# Patient Record
Sex: Male | Born: 1955
Health system: Southern US, Community
[De-identification: ages and names within clinical notes are randomized; demographics above are authoritative.]

## PROBLEM LIST (undated history)

## (undated) DIAGNOSIS — Z87442 Personal history of urinary calculi: Secondary | ICD-10-CM

## (undated) DIAGNOSIS — I10 Essential (primary) hypertension: Secondary | ICD-10-CM

## (undated) HISTORY — PX: APPENDECTOMY: SHX54

---

## 2002-05-26 ENCOUNTER — Encounter: Payer: Self-pay | Admitting: General Surgery

## 2002-05-26 ENCOUNTER — Encounter: Admission: RE | Admit: 2002-05-26 | Discharge: 2002-05-26 | Payer: Self-pay | Admitting: General Surgery

## 2002-05-27 ENCOUNTER — Ambulatory Visit (HOSPITAL_BASED_OUTPATIENT_CLINIC_OR_DEPARTMENT_OTHER): Admission: RE | Admit: 2002-05-27 | Discharge: 2002-05-27 | Payer: Self-pay | Admitting: General Surgery

## 2007-10-30 ENCOUNTER — Ambulatory Visit: Payer: Self-pay | Admitting: Urology

## 2007-11-06 ENCOUNTER — Ambulatory Visit: Payer: Self-pay | Admitting: Urology

## 2007-11-20 ENCOUNTER — Ambulatory Visit: Payer: Self-pay | Admitting: Urology

## 2007-11-27 ENCOUNTER — Ambulatory Visit: Payer: Self-pay | Admitting: Urology

## 2007-12-09 ENCOUNTER — Ambulatory Visit: Payer: Self-pay | Admitting: Urology

## 2008-01-09 ENCOUNTER — Ambulatory Visit: Payer: Self-pay | Admitting: Urology

## 2008-02-19 ENCOUNTER — Ambulatory Visit: Payer: Self-pay | Admitting: Urology

## 2008-05-24 ENCOUNTER — Ambulatory Visit: Payer: Self-pay | Admitting: Urology

## 2009-06-10 IMAGING — CR DG ABDOMEN 1V
1 series · 1 of 1 positions shown · non-contrast
Comparison: none

REASON FOR EXAM: kidney stones pt need films
COMMENTS:

[view not recorded]
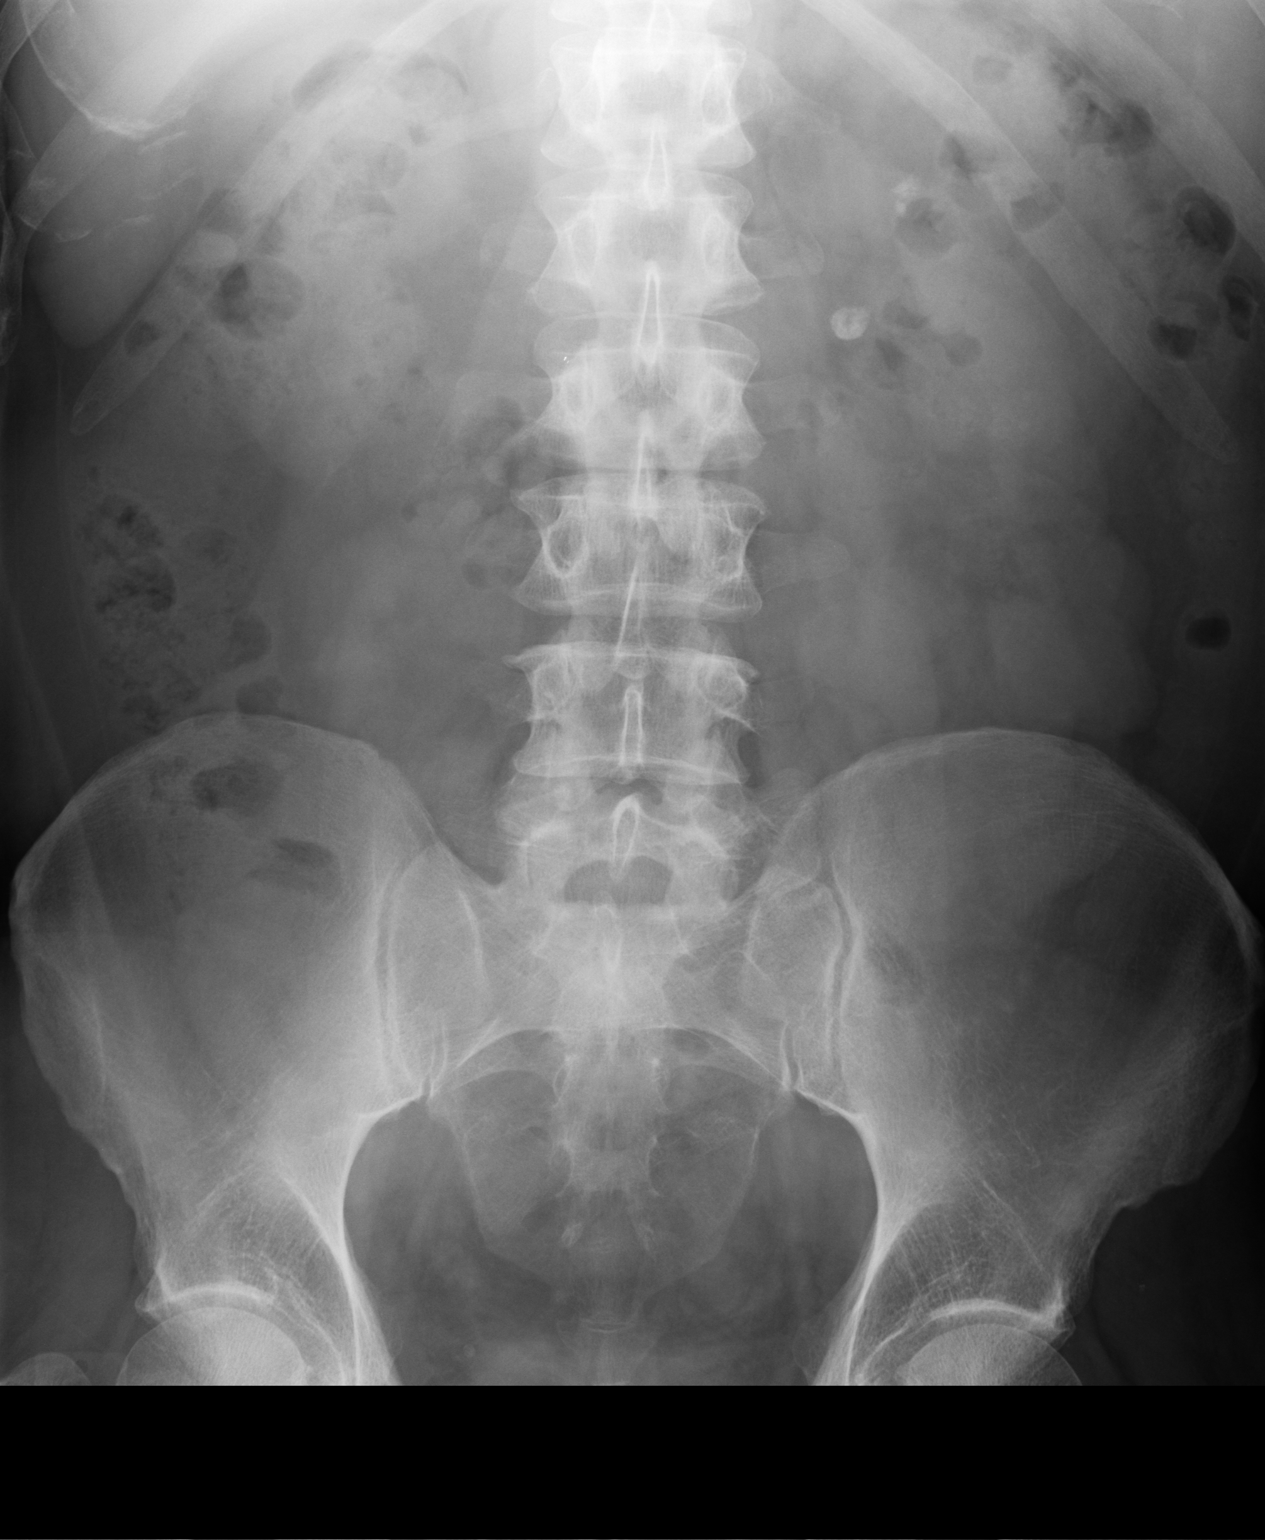

[1 of 1 positions shown; findings below may reference images not displayed]

PROCEDURE:     DXR - DXR KIDNEY URETER BLADDER  - November 20, 2007  [DATE]

RESULT:     Comparison is made to the prior exam of 11/06/2007. Multiple LEFT
renal calcifications are noted in the midpole region. A 1 cm calcification
is present in the region of the LEFT renal pelvis or proximal LEFT ureter
and which corresponds to the location of a similar stone noted at prior CT.
No renal calcifications are seen on the RIGHT.
IMPRESSION: 1.     LEFT nephrolithiasis with there being a 1 cm stone in the LEFT renal
pelvis or proximal LEFT ureter.

## 2009-06-29 IMAGING — CR DG ABDOMEN 1V
1 series · 1 of 1 positions shown · non-contrast
Comparison: none

REASON FOR EXAM: Nephrolithiasis - SEND FILM WITH PATIENT
COMMENTS:

[view not recorded]
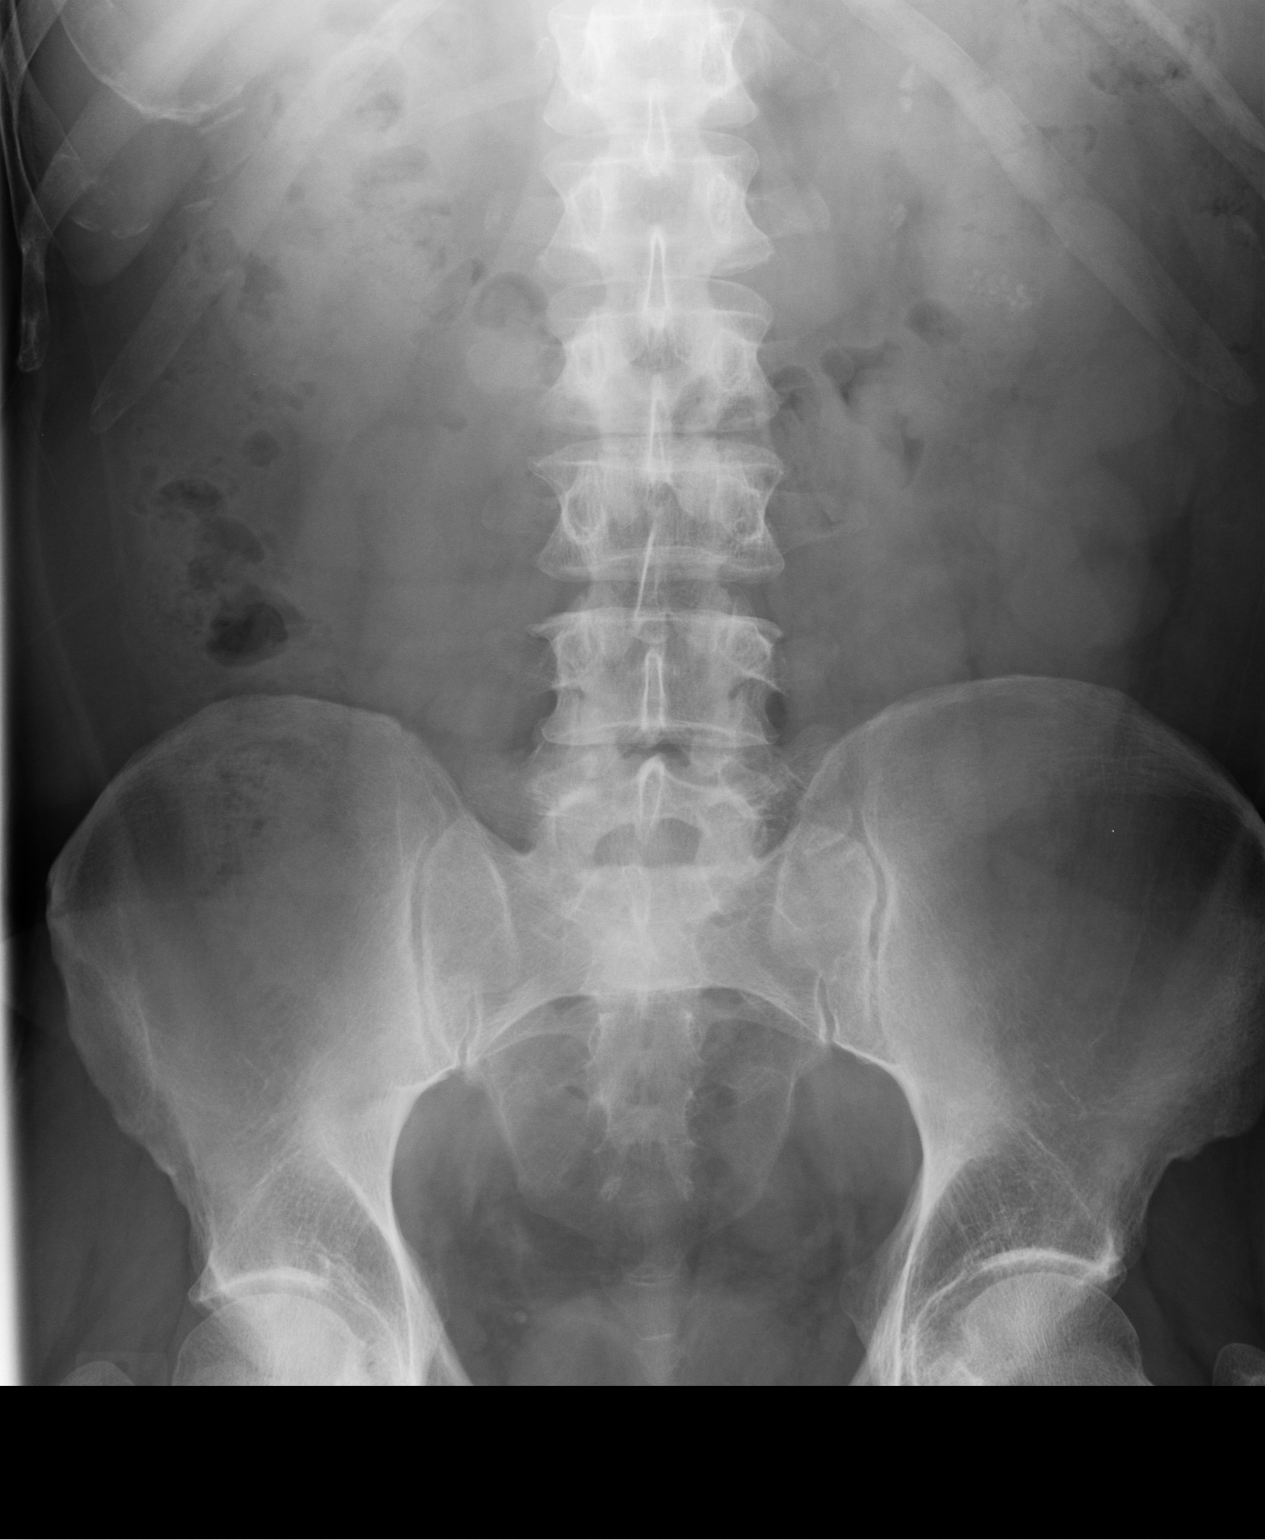

[1 of 1 positions shown; findings below may reference images not displayed]

PROCEDURE:     DXR - DXR KIDNEY URETER BLADDER  - December 09, 2007  [DATE]

RESULT:     Comparison is made to a prior exam 11/27/2007. The large
calcification at the LEFT renal pelvis is markedly smaller and now measures
approximately 6.5 mm at maximum diameter. Multiple stones or stone fragments
are noted in the LEFT lower pole calyces. Two or three stones are again seen
at the upper pole of the LEFT kidney. No definite RIGHT renal stones are
seen. No ureteral calcifications are identified.
IMPRESSION: 1.     LEFT nephrolithiasis as noted above.

## 2009-12-30 ENCOUNTER — Ambulatory Visit: Payer: Self-pay | Admitting: Urology

## 2010-02-03 ENCOUNTER — Ambulatory Visit: Payer: Self-pay | Admitting: Urology

## 2010-02-09 ENCOUNTER — Ambulatory Visit: Payer: Self-pay | Admitting: Urology

## 2010-03-13 ENCOUNTER — Ambulatory Visit: Payer: Self-pay | Admitting: Urology

## 2010-03-23 ENCOUNTER — Ambulatory Visit: Payer: Self-pay | Admitting: Urology

## 2010-04-04 ENCOUNTER — Ambulatory Visit: Payer: Self-pay | Admitting: Urology

## 2010-04-24 ENCOUNTER — Ambulatory Visit: Payer: Self-pay | Admitting: Urology

## 2010-05-05 ENCOUNTER — Ambulatory Visit: Payer: Self-pay | Admitting: Urology

## 2010-05-08 ENCOUNTER — Ambulatory Visit: Payer: Self-pay | Admitting: Urology

## 2010-08-07 ENCOUNTER — Ambulatory Visit: Payer: Self-pay | Admitting: Urology

## 2010-09-27 ENCOUNTER — Other Ambulatory Visit: Payer: Self-pay | Admitting: Neurosurgery

## 2010-09-27 DIAGNOSIS — M47812 Spondylosis without myelopathy or radiculopathy, cervical region: Secondary | ICD-10-CM

## 2010-09-28 ENCOUNTER — Ambulatory Visit
Admission: RE | Admit: 2010-09-28 | Discharge: 2010-09-28 | Disposition: A | Discharge status: Home / Self Care | Payer: 59 | Source: Ambulatory Visit | Attending: Neurosurgery | Admitting: Neurosurgery

## 2010-09-28 DIAGNOSIS — M47812 Spondylosis without myelopathy or radiculopathy, cervical region: Secondary | ICD-10-CM

## 2010-11-07 ENCOUNTER — Other Ambulatory Visit: Payer: Self-pay | Admitting: Neurosurgery

## 2010-11-07 DIAGNOSIS — M47812 Spondylosis without myelopathy or radiculopathy, cervical region: Secondary | ICD-10-CM

## 2010-11-08 ENCOUNTER — Ambulatory Visit
Admission: RE | Admit: 2010-11-08 | Discharge: 2010-11-08 | Disposition: A | Discharge status: Home / Self Care | Payer: 59 | Source: Ambulatory Visit | Attending: Neurosurgery | Admitting: Neurosurgery

## 2010-11-08 DIAGNOSIS — M47812 Spondylosis without myelopathy or radiculopathy, cervical region: Secondary | ICD-10-CM

## 2011-02-05 ENCOUNTER — Ambulatory Visit: Payer: Self-pay | Admitting: Urology

## 2011-08-25 IMAGING — CR DG IVP HYPERTENSIVE
1 series · 8 of 10 positions shown · non-contrast
Comparison: none

REASON FOR EXAM: nephrolithiasis
COMMENTS:

[Series 1: view not recorded · 0.17mm/px · 8 of 14 slices shown]
[im 1/14]
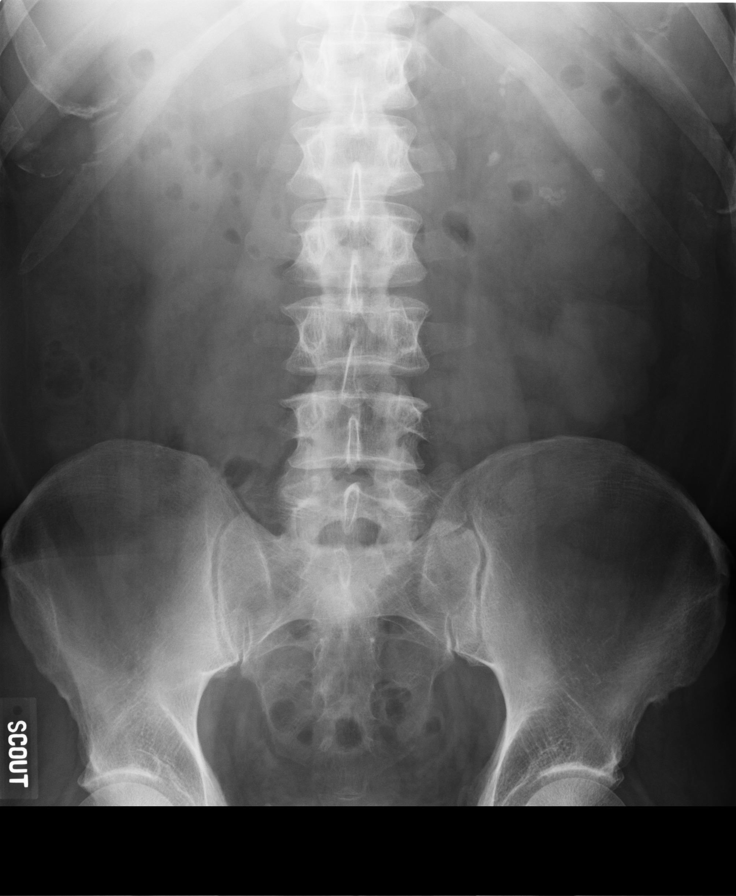
[im 2/14]
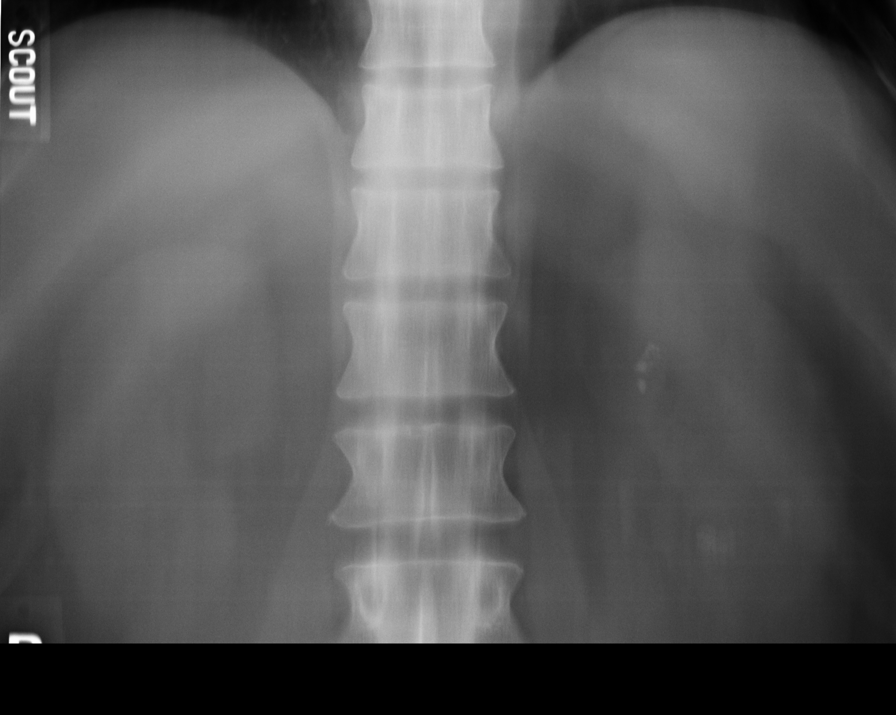
[im 3/14]
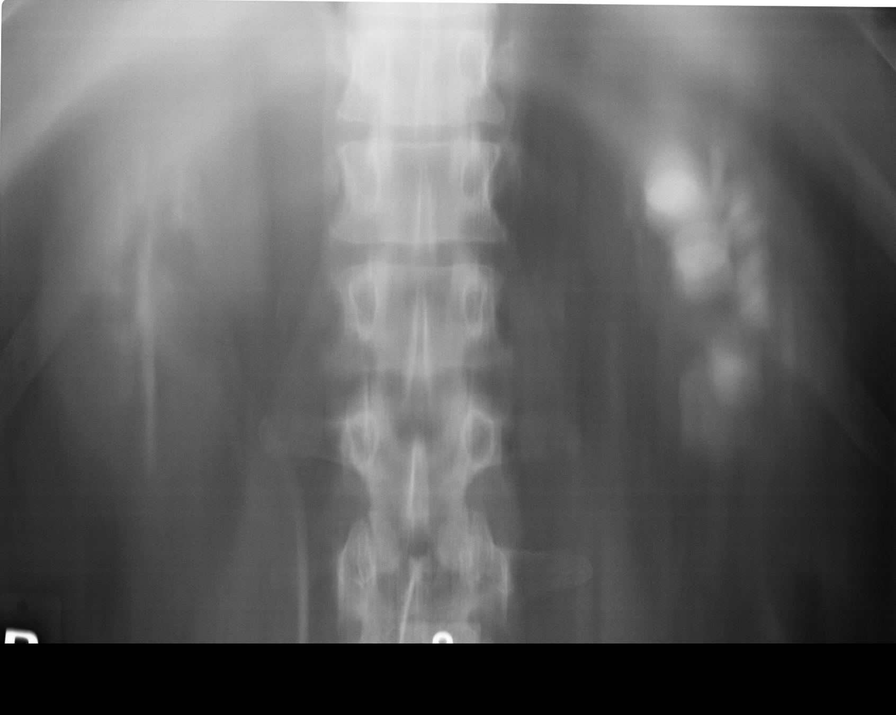
[im 5/14]
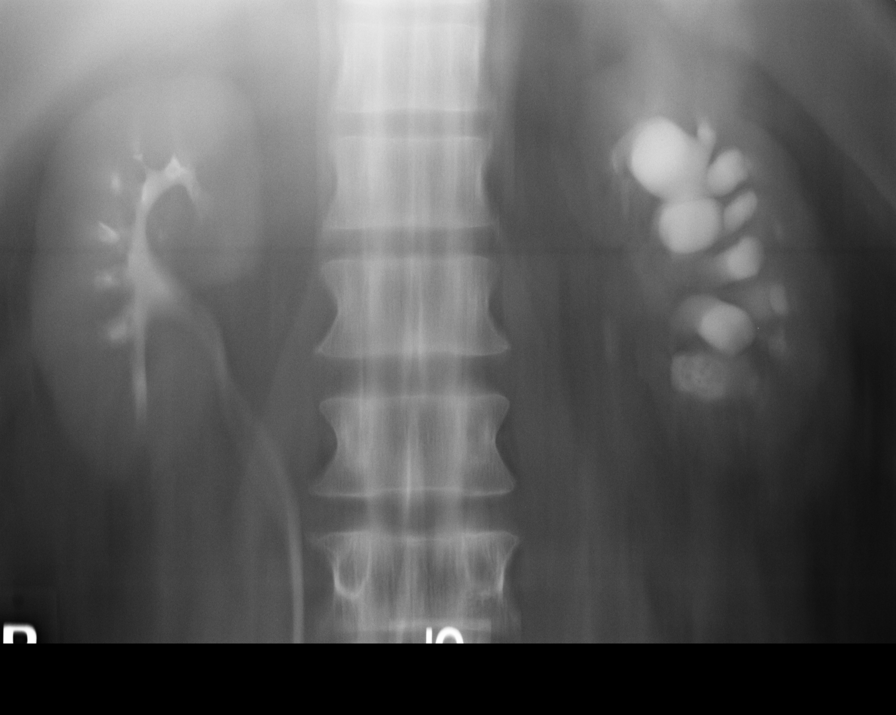
[im 6/14]
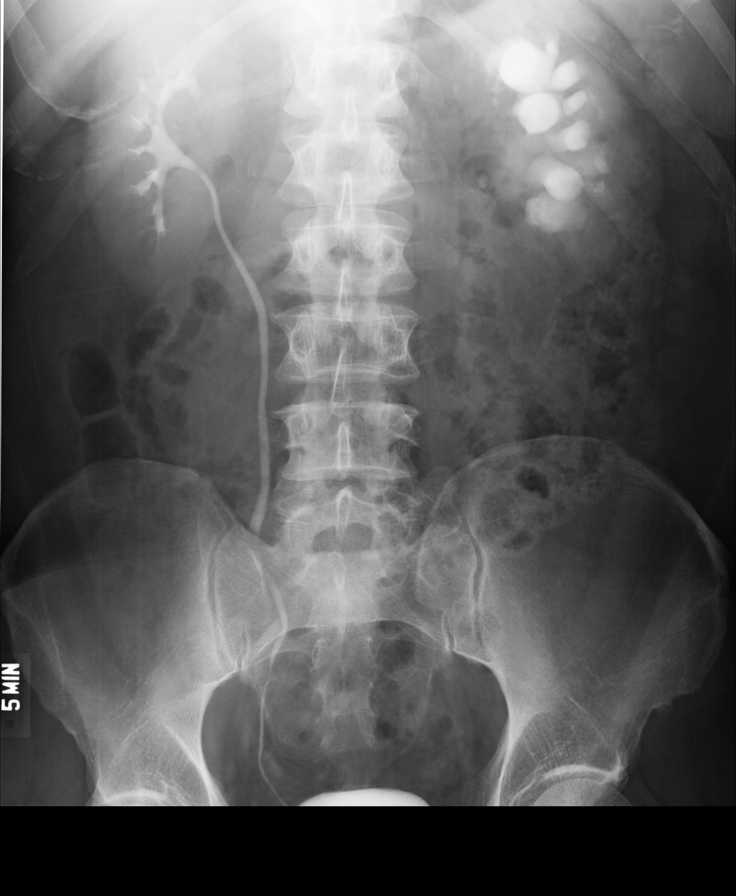
[im 8/14]
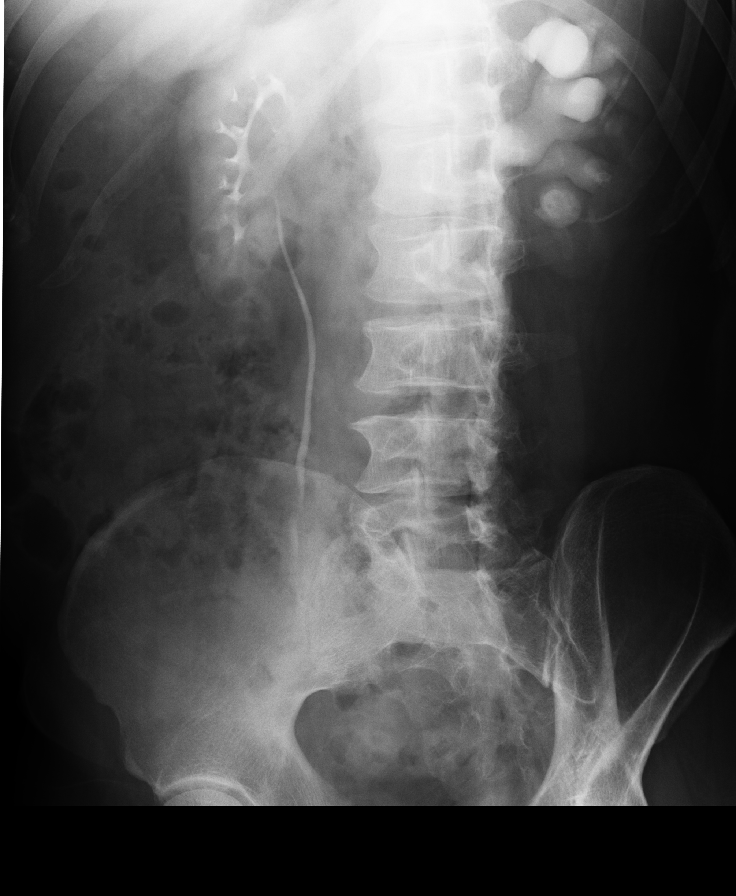
[im 9/14]
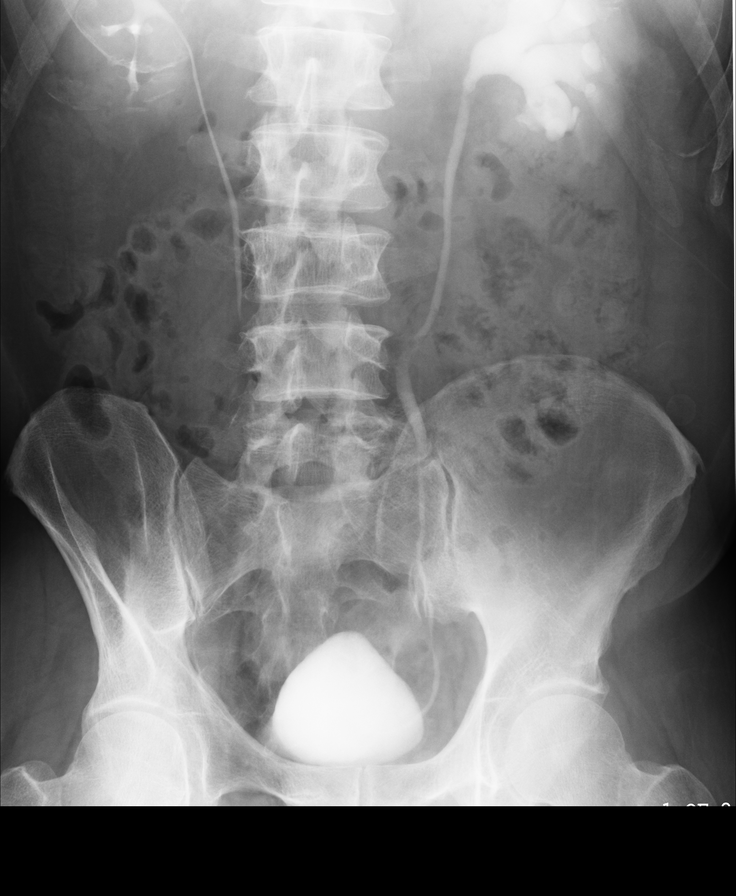
[im 11/14]
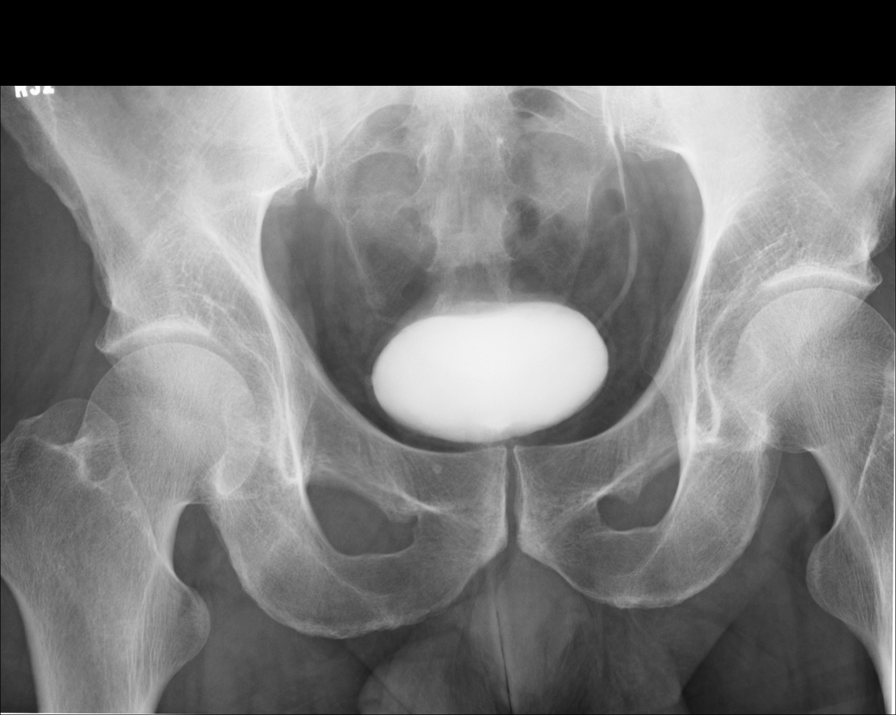

[8 of 10 positions shown; findings below may reference images not displayed]

PROCEDURE:     DXR - DXR INTRAVENOUS UROGRAPHY (IVP)  - February 03, 2010 [DATE]

RESULT:     The anticipated procedure was discussed with Mr. Giorgi. He
voiced his willingness to proceed. The scout film revealed at least 6
calcific densities projecting over the left kidney. None were evident over
the right kidney. The bony structures appear normal and the bowel gas
pattern appears normal.

The patient initially received an injection via the left antecubital vein
but extravasation of approximately 10 to 15 cc of the Kptiray-KJ8 occurred
and the bolus was not completed. Subsequently the area was treated with hot
compresses. IV access was then obtained in the right antecubital fossa and
and the patient subsequently received 50 cc of Kptiray-KJ8.

There is prompt visualization of the renal outlines. The right kidney
measures approximately 13.1 cm in length and the left kidney approximately
13.2 cm in length uncorrected for magnification. Calcifications are visible
over the left kidney on the tomographic views. There is mild to moderate
hydronephrosis on the left which appears to be related to a partially
obstructing stone at the ureteral pelvic junction. The stone measures
roughly 6 mm in diameter. In the prone position contrast did fill the ureter
on the left with a normal caliber seen distal to the stone.

On the right the intrarenal collecting systems and ureter were normal in
appearance. The partially distended urinary bladder was normal in
appearance. A tiny post void residual urinary bladder volume was
demonstrated.
IMPRESSION: 1. There is mild to moderate hydronephrosis on the left secondary to partial
obstruction of the left ureter at the ureteropelvic junction by a 6 mm
diameter stone. There are other nonobstructing stones seen in the left renal
collecting system.
2. The appearance of the right kidney and ureter and urinary bladder is
within the limits of normal.
3. The area of extravasation of contrast into the left antecubital region
exhibited no significant abnormality at the conclusion of the examination.
The patient was counseled to return to the emergency department if redness,
swelling, or increased pain, or skin breakdown occurred.

## 2011-08-31 IMAGING — CR DG ABDOMEN 1V
1 series · 2 of 2 positions shown · non-contrast
Comparison: none

REASON FOR EXAM: renal calculi-lithotripsy
COMMENTS:

[Series 1: view not recorded · 0.17mm/px · 2 of 2 slices shown]
[im 1/2]
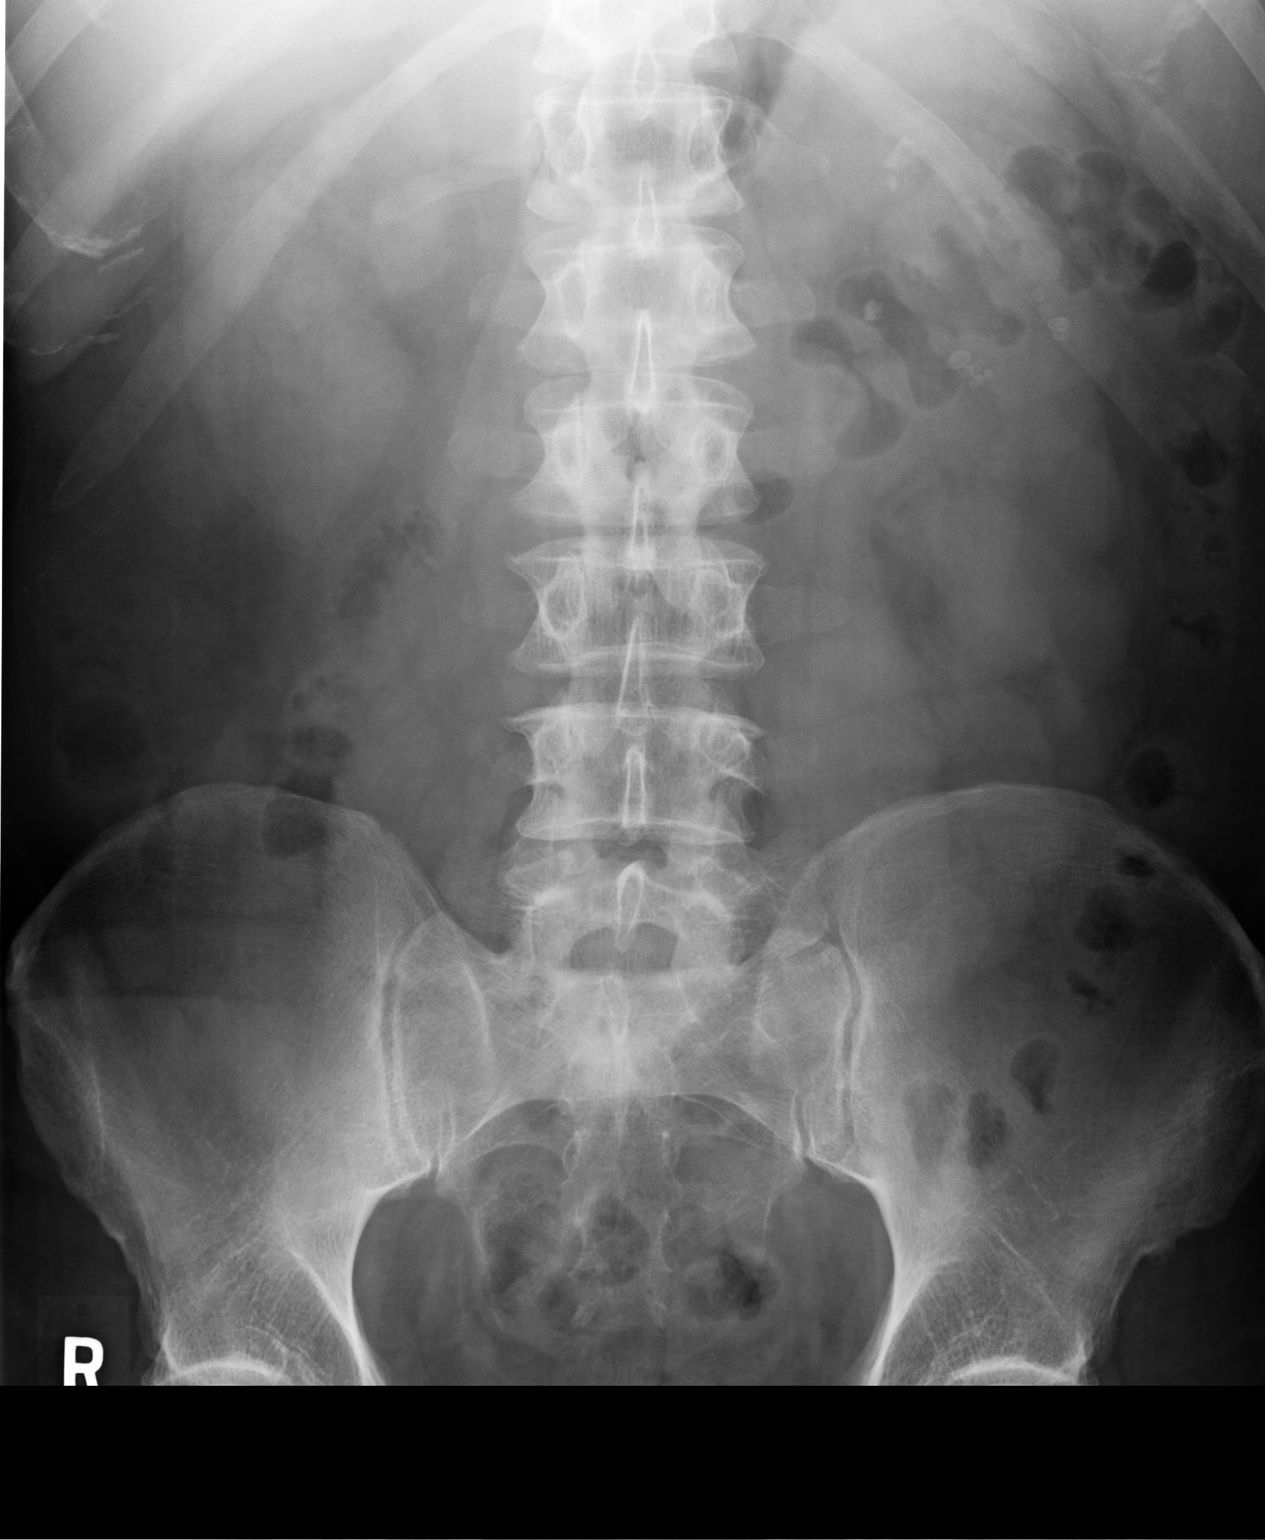
[im 2/2]
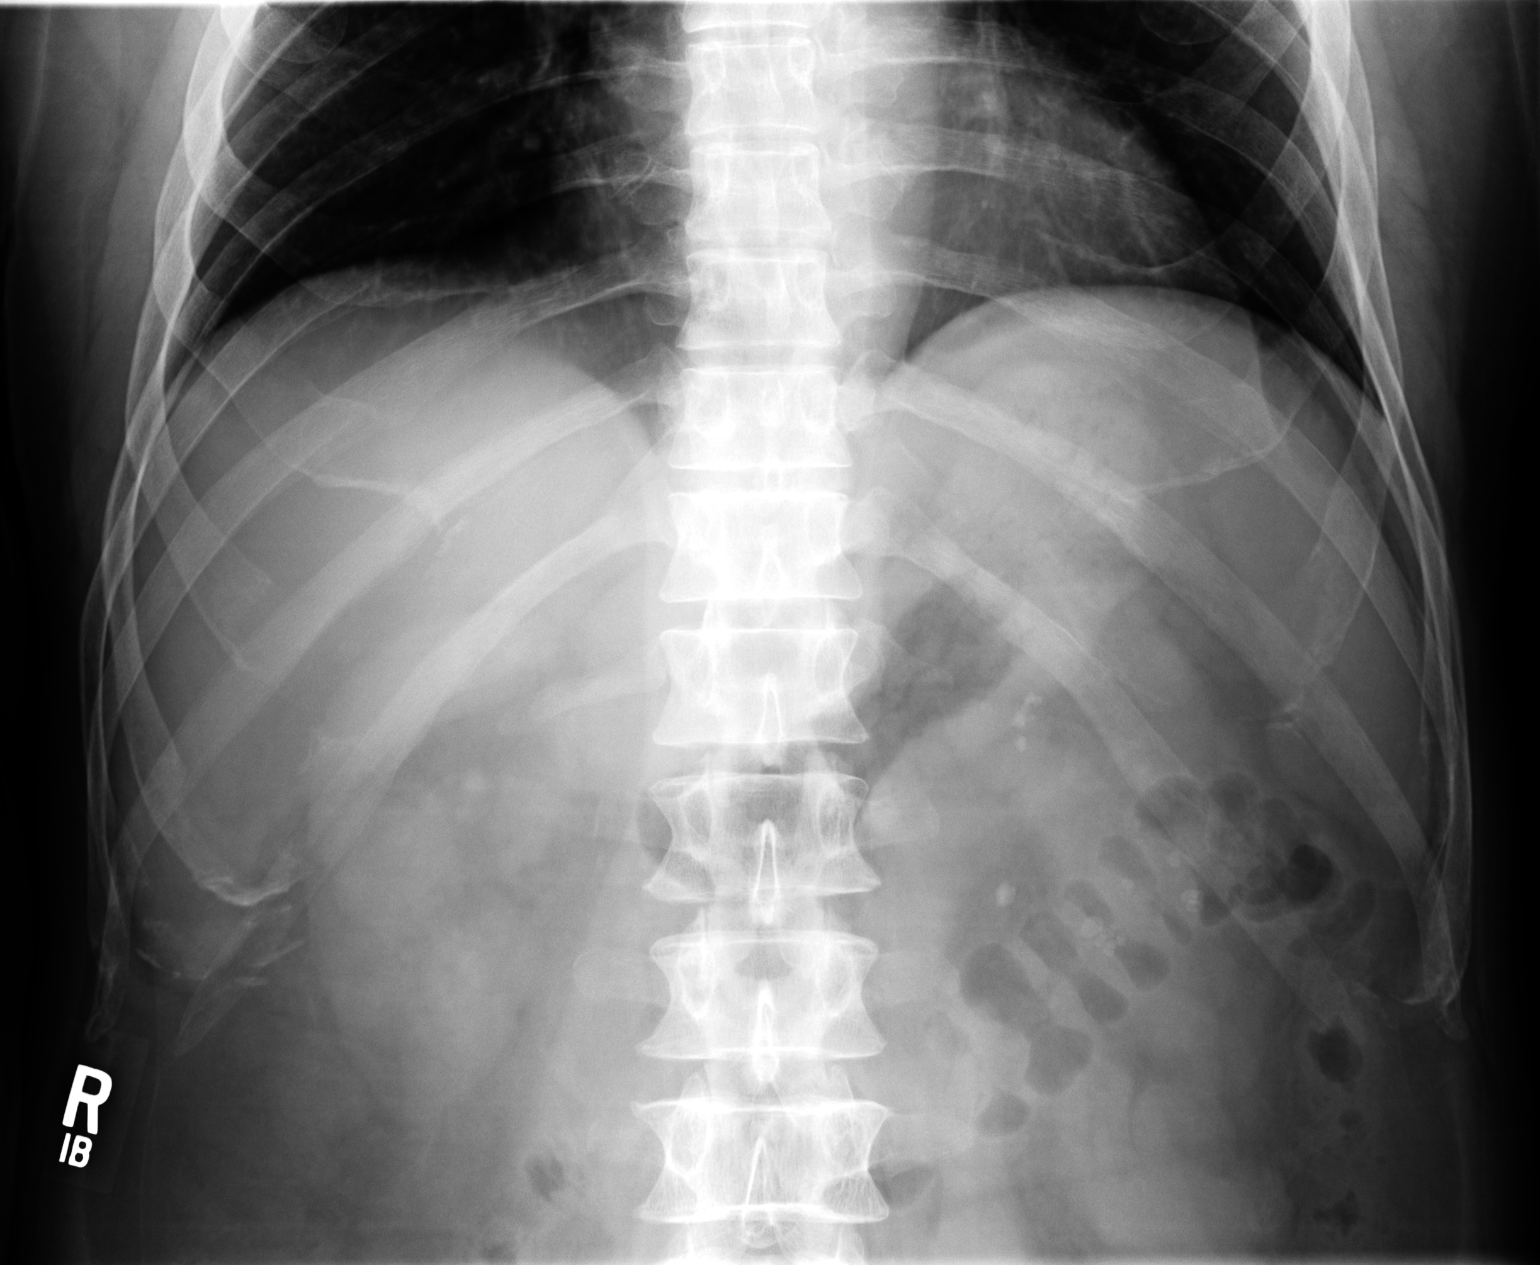

[2 of 2 positions shown; findings below may reference images not displayed]

PROCEDURE:     DXR - DXR KIDNEY URETER BLADDER  - February 09, 2010  [DATE]

RESULT:     Comparison is made to the study 30 December, 2009.

Multiple calcified stones project over the left kidney. There is a faint
calcific density that overlies the tip of the left transverse process of L4.
I do not see definite evidence of stones in the region of the bladder. No
definite stones are demonstrated on the right.
IMPRESSION: There are multiple calcified stones projecting over the
left kidney little changed from the previous study. I cannot exclude a tiny
stone projecting over the tip of the left L4 transverse process.

## 2011-11-13 IMAGING — CR DG ABDOMEN 1V
1 series · 2 of 2 positions shown · non-contrast
Comparison: none

REASON FOR EXAM: nephrolithiasis
COMMENTS:

[Series 1: view not recorded · 0.17mm/px · 2 of 2 slices shown]
[im 1/2]
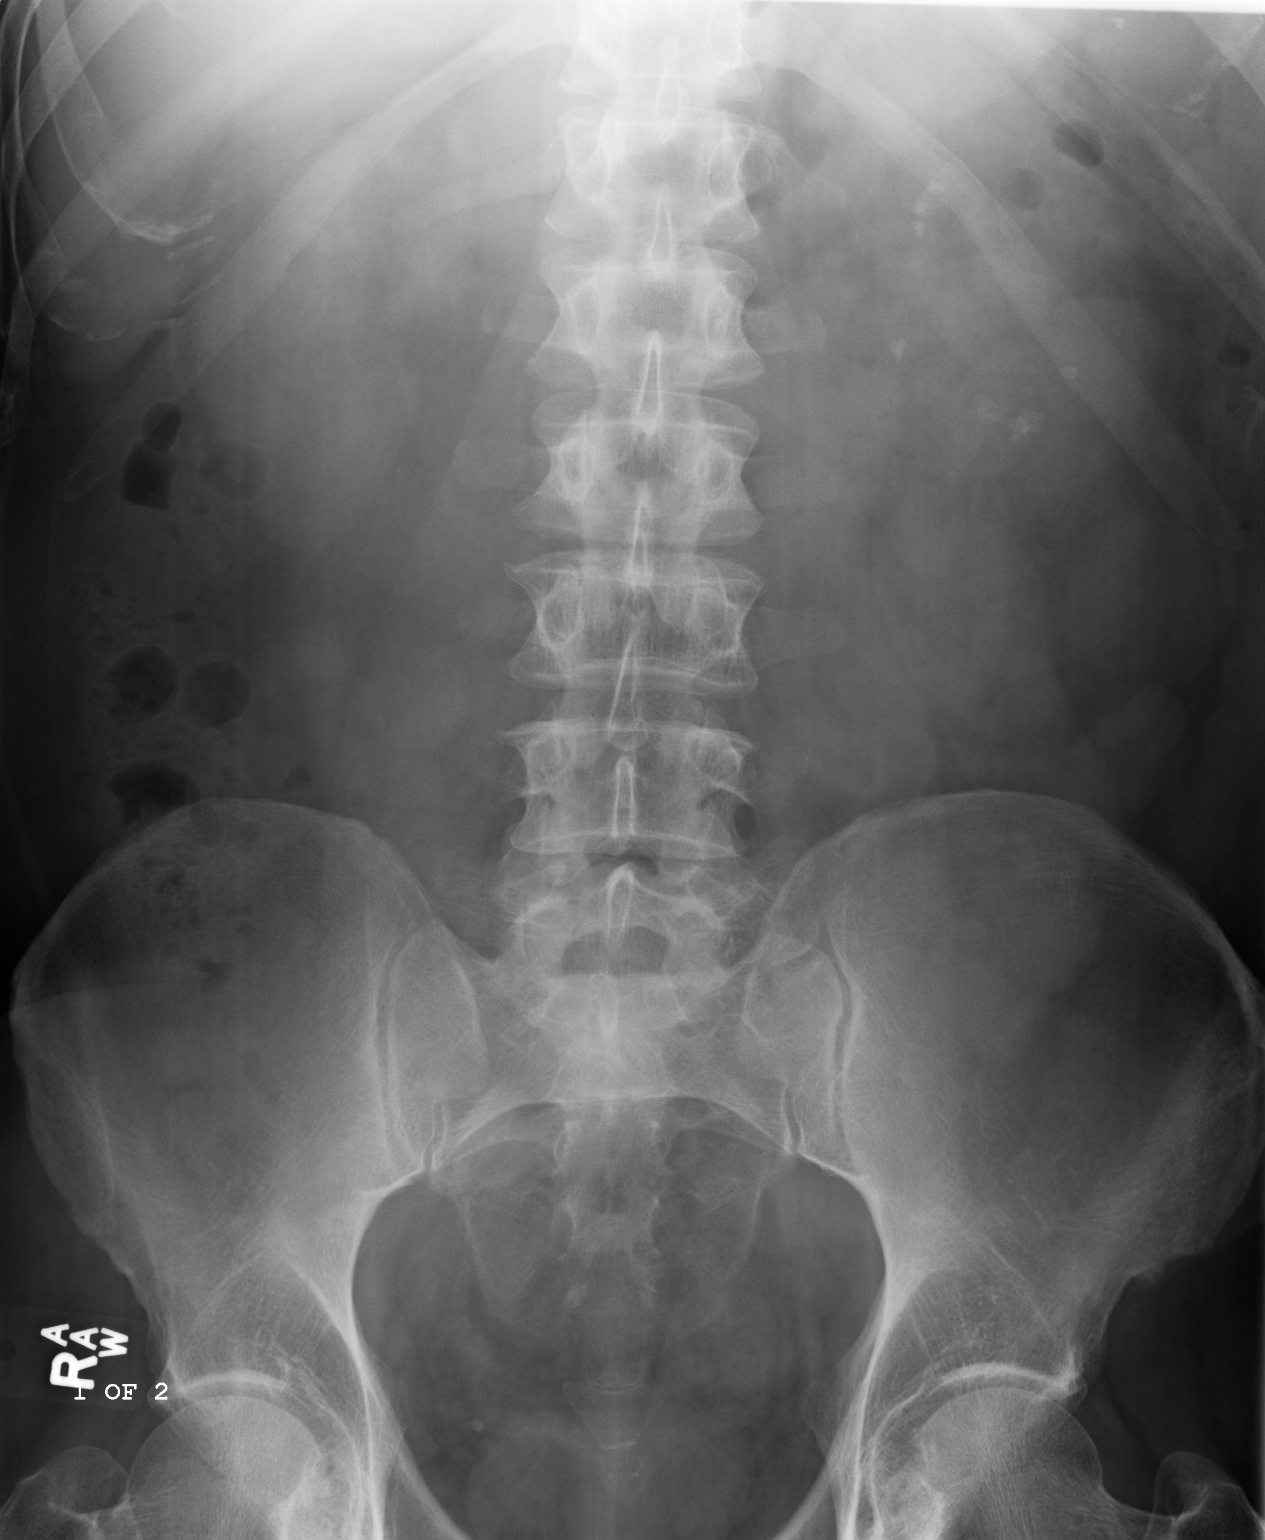
[im 2/2]
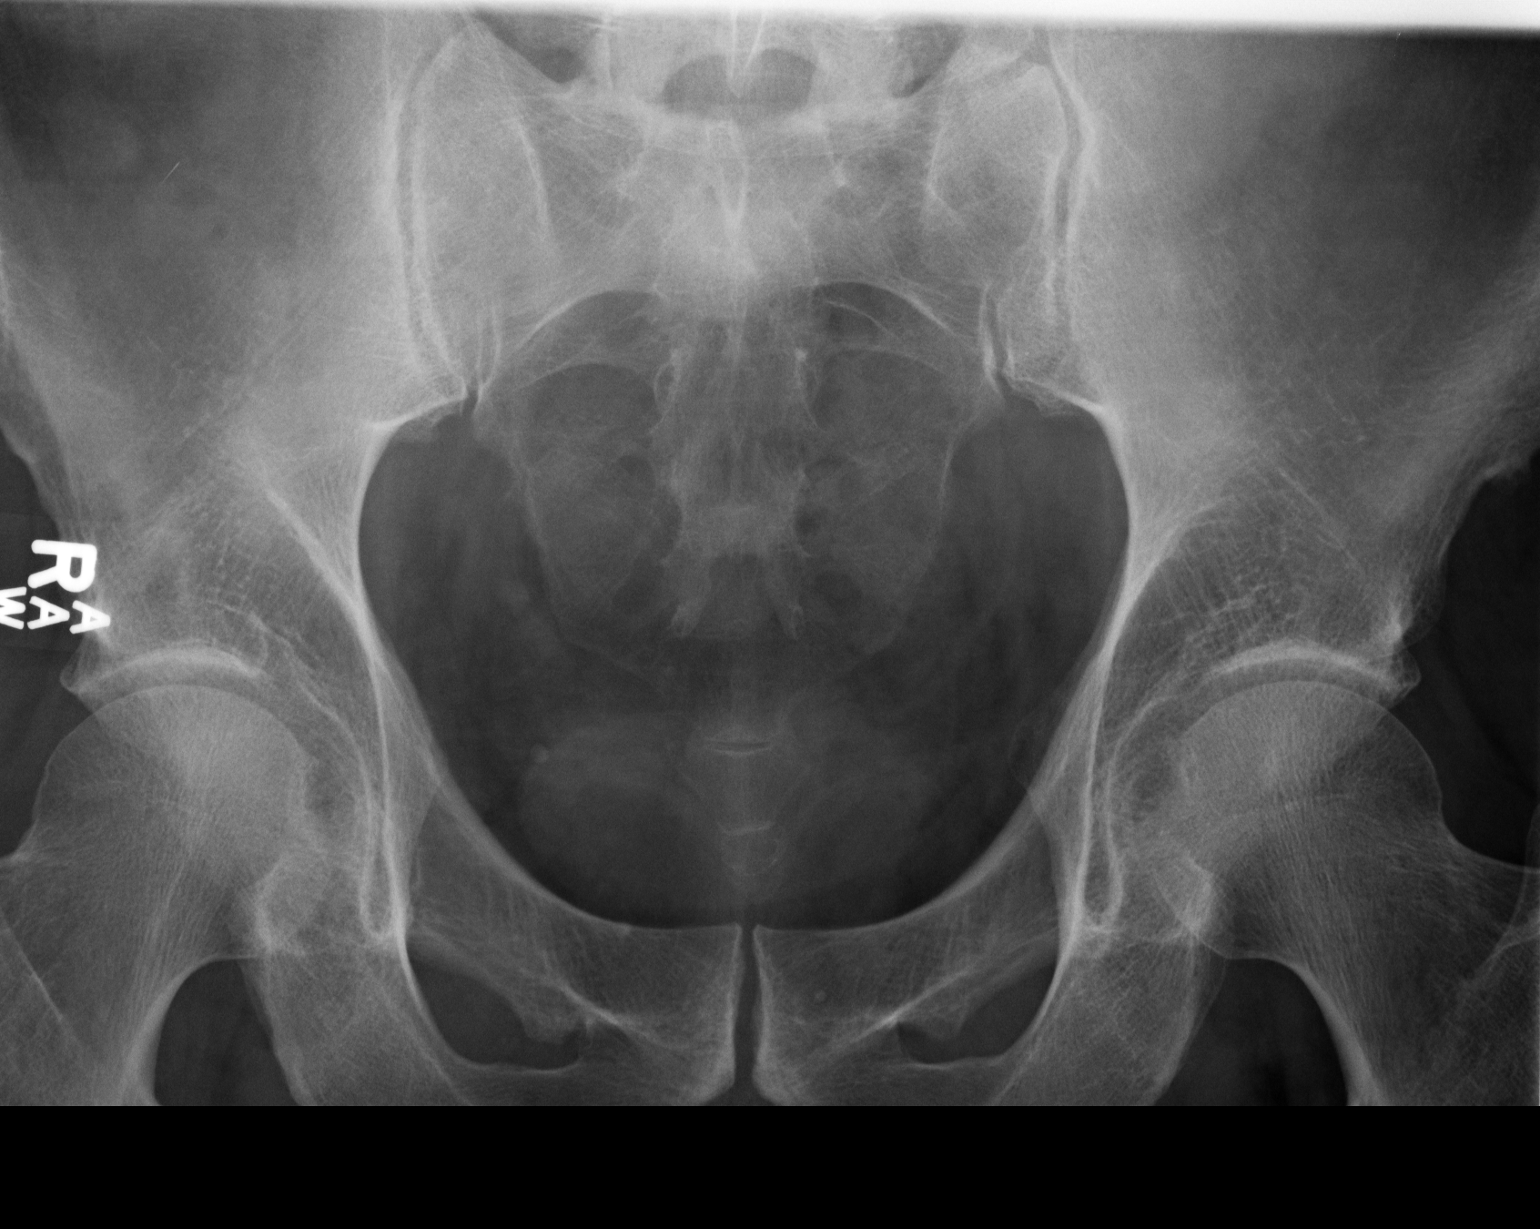

[2 of 2 positions shown; findings below may reference images not displayed]

PROCEDURE:     DXR - DXR KIDNEY URETER BLADDER  - April 24, 2010  [DATE]

RESULT:     Comparison is made to prior examinations of 04/04/2010 and
03/23/2010. The current exam shows multiple calcifications projected over the
mid and lower pole of the left kidney. Additionally, there is a triangular 6
mm calcification projected along the medial aspect of the left kidney
consistent with a stone in the left renal pelvis or proximal left ureter. No
definite renal or ureteral calcifications are seen on the right. A
phlebolith is again noted in the right pelvis.
IMPRESSION: 1. Left nephrolithiasis.
2. There is a stone projected at the level of the left renal pelvis.

## 2015-01-29 ENCOUNTER — Emergency Department
Admission: EM | Admit: 2015-01-29 | Discharge: 2015-01-29 | Disposition: A | Discharge status: Home / Self Care | Payer: 59 | Attending: Emergency Medicine | Admitting: Emergency Medicine

## 2015-01-29 ENCOUNTER — Encounter: Payer: Self-pay | Admitting: Emergency Medicine

## 2015-01-29 DIAGNOSIS — S199XXA Unspecified injury of neck, initial encounter: Secondary | ICD-10-CM | POA: Diagnosis present

## 2015-01-29 DIAGNOSIS — I10 Essential (primary) hypertension: Secondary | ICD-10-CM | POA: Diagnosis not present

## 2015-01-29 DIAGNOSIS — X58XXXA Exposure to other specified factors, initial encounter: Secondary | ICD-10-CM | POA: Diagnosis not present

## 2015-01-29 DIAGNOSIS — Y990 Civilian activity done for income or pay: Secondary | ICD-10-CM | POA: Insufficient documentation

## 2015-01-29 DIAGNOSIS — Y9289 Other specified places as the place of occurrence of the external cause: Secondary | ICD-10-CM | POA: Diagnosis not present

## 2015-01-29 DIAGNOSIS — S161XXA Strain of muscle, fascia and tendon at neck level, initial encounter: Secondary | ICD-10-CM | POA: Insufficient documentation

## 2015-01-29 DIAGNOSIS — Y9389 Activity, other specified: Secondary | ICD-10-CM | POA: Diagnosis not present

## 2015-01-29 HISTORY — DX: Essential (primary) hypertension: I10

## 2015-01-29 MED ORDER — KETOROLAC TROMETHAMINE 60 MG/2ML IM SOLN
INTRAMUSCULAR | Status: AC
  Administered 2015-01-29: 60 mg via INTRAMUSCULAR
  Filled 2015-01-29: qty 2

## 2015-01-29 MED ORDER — CYCLOBENZAPRINE HCL 10 MG PO TABS: 10.0000 mg | ORAL_TABLET | Freq: Three times a day (TID) | ORAL | Status: DC | PRN

## 2015-01-29 MED ORDER — HYDROCODONE-ACETAMINOPHEN 5-325 MG PO TABS: 1.0000 | ORAL_TABLET | ORAL | Status: DC | PRN

## 2015-01-29 MED ORDER — IBUPROFEN 800 MG PO TABS: 800.0000 mg | ORAL_TABLET | Freq: Three times a day (TID) | ORAL | Status: DC | PRN

## 2015-01-29 MED ORDER — KETOROLAC TROMETHAMINE 30 MG/ML IJ SOLN: 60.0000 mg | Freq: Once | INTRAMUSCULAR | Status: DC

## 2015-01-29 NOTE — ED Provider Notes (Signed)
[facility] Emergency Department Provider Note  ____________________________________________  Time seen: Approximately 9:21 AM  I have reviewed the triage vital signs and the nursing notes.   HISTORY  Chief Complaint Neck Pain    HPI Timothy Wilkins is a 59 y.o. male presents for evaluation of the acute onset of right cervical muscle strain. States symptoms started last night and progressively got worse throughout the night and this morning. 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 Patient complains of increased pain with movement of the head.   Past Medical History  Diagnosis Date  . Hypertension     There are no active problems to display for this patient.   History reviewed. No pertinent past surgical history.  Current Outpatient Rx  Name  Route  Sig  Dispense  Refill  . cyclobenzaprine (FLEXERIL) 10 MG tablet   Oral   Take 1 tablet (10 mg total) by mouth every 8 (eight) hours as needed for muscle spasms.   30 tablet   1   . HYDROcodone-acetaminophen (NORCO) 5-325 MG per tablet   Oral   Take 1-2 tablets by mouth every 4 (four) hours as needed for moderate pain.   15 tablet   0   . ibuprofen (ADVIL,MOTRIN) 800 MG tablet   Oral   Take 1 tablet (800 mg total) by mouth every 8 (eight) hours as needed.   30 tablet   0     Allergies Review of patient's allergies indicates no known allergies.  History reviewed. No pertinent family history.  Social History History  Substance Use Topics  . Smoking status: Never Smoker   . Smokeless tobacco: Not on file  . Alcohol Use: No    Review of Systems Constitutional: No fever/chills Eyes: No visual changes. ENT: No sore throat. Cardiovascular: Denies chest pain. Respiratory: Denies shortness of breath. Gastrointestinal: No abdominal pain.  No nausea, no vomiting.  No diarrhea.  No constipation. Genitourinary: Negative for dysuria. Musculoskeletal: Positive  for neck pain. Skin: Negative for rash. Neurological: Negative for headaches, focal weakness or numbness.  10-point ROS otherwise negative.  ____________________________________________   PHYSICAL EXAM:  VITAL SIGNS: ED Triage Vitals  Enc Vitals Group     BP 01/29/15 0912 158/100 mmHg     Pulse Rate 01/29/15 0912 72     Resp 01/29/15 0912 18     Temp 01/29/15 0912 98 F (36.7 C)     Temp Source 01/29/15 0912 Oral     SpO2 01/29/15 0912 98 %     Weight 01/29/15 0912 188 lb (85.276 kg)     Height 01/29/15 0912 6' (1.829 m)     Head Cir --      Peak Flow --      Pain Score 01/29/15 0913 10     Pain Loc --      Pain Edu? --      Excl. in GC? --     Constitutional: Alert and oriented. Well appearing and in no acute distress. Eyes: Conjunctivae are normal. PERRL. EOMI. Head: Atraumatic. Nose: No congestion/rhinnorhea. Mouth/Throat: Mucous membranes are moist.  Oropharynx non-erythematous. Neck: No stridor.  Positive tenderness with limited range of motion with flexion extension lateralization. Cardiovascular: Normal rate, regular rhythm. Grossly normal heart sounds.  Good peripheral circulation. Respiratory: Normal respiratory effort.  No retractions. Lungs CTAB. Gastrointestinal: Soft and nontender. No distention. No abdominal bruits. No CVA tenderness. Musculoskeletal: No lower extremity tenderness nor edema.  No joint effusions.  Neurologic:  Normal speech and language. No gross focal neurologic deficits are appreciated. Speech is normal. No gait instability. Skin:  Skin is warm, dry and intact. No rash noted. Psychiatric: Mood and affect are normal. Speech and behavior are normal.  ____________________________________________   LABS (all labs ordered are listed, but only abnormal results are displayed)  Labs Reviewed - No data to display ____________________________________________  EKG  Not  applicable ____________________________________________  RADIOLOGY  Deferred ____________________________________________   PROCEDURES  Procedure(s) performed: None  Critical Care performed: No  ____________________________________________   INITIAL IMPRESSION / ASSESSMENT AND PLAN / ED COURSE  Pertinent labs & imaging results that were available during my care of the patient were reviewed by me and considered in my medical decision making (see chart for details).  Diagnosed with acute cervical muscle strain. Rx given for Flexeril 10 mg ibuprofen 800 mg and he is to follow up with PCP or return to the ER did it with worsening symptoms ____________________________________________   FINAL CLINICAL IMPRESSION(S) / ED DIAGNOSES  Final diagnoses:  Cervical muscle strain, initial encounter      Evangeline Dakin, PA-C 01/29/15 1115  Governor Rooks, MD 01/29/15 [phone]

## 2015-01-29 NOTE — ED Notes (Signed)
Pt to ed with c/o neck pain since last night reports decreased ROM.

## 2015-01-29 NOTE — Discharge Instructions (Signed)
Muscle Strain A muscle strain is an injury that occurs when a muscle is stretched beyond its normal length. Usually a small number of muscle fibers are torn when this happens. Muscle strain is rated in degrees. First-degree strains have the least amount of muscle fiber tearing and pain. Second-degree and third-degree strains have increasingly more tearing and pain.  Usually, recovery from muscle strain takes 1-2 weeks. Complete healing takes 5-6 weeks.  CAUSES  Muscle strain happens when a sudden, violent force placed on a muscle stretches it too far. This may occur with lifting, sports, or a fall.  RISK FACTORS Muscle strain is especially common in athletes.  SIGNS AND SYMPTOMS At the site of the muscle strain, there may be:  Pain.  Bruising.  Swelling.  Difficulty using the muscle due to pain or lack of normal function. DIAGNOSIS  Your health care provider will perform a physical exam and ask about your medical history. TREATMENT  Often, the best treatment for a muscle strain is resting, icing, and applying cold compresses to the injured area.  HOME CARE INSTRUCTIONS   Use the PRICE method of treatment to promote muscle healing during the first 2-3 days after your injury. The PRICE method involves:  Protecting the muscle from being injured again.  Restricting your activity and resting the injured body part.  Icing your injury. To do this, put ice in a plastic bag. Place a towel between your skin and the bag. Then, apply the ice and leave it on from 15-20 minutes each hour. After the third day, switch to moist heat packs.  Apply compression to the injured area with a splint or elastic bandage. Be careful not to wrap it too tightly. This may interfere with blood circulation or increase swelling.  Elevate the injured body part above the level of your heart as often as you can.  Only take over-the-counter or prescription medicines for pain, discomfort, or fever as directed by your  health care provider.  Warming up prior to exercise helps to prevent future muscle strains. SEEK MEDICAL CARE IF:   You have increasing pain or swelling in the injured area.  You have numbness, tingling, or a significant loss of strength in the injured area. MAKE SURE YOU:   Understand these instructions.  Will watch your condition.  Will get help right away if you are not doing well or get worse. Document Released: 08/13/2005 Document Revised: 06/03/2013 Document Reviewed: 03/12/2013 [facility] Patient Information 2015 [place]. This information is not intended to replace advice given to you by your health care provider. Make sure you discuss any questions you have with your health care provider.  Cervical Sprain A cervical sprain is when the tissues (ligaments) that hold the neck bones in place stretch or tear. HOME CARE   Put ice on the injured area.  Put ice in a plastic bag.  Place a towel between your skin and the bag.  Leave the ice on for 15-20 minutes, 3-4 times a day.  You may have been given a collar to wear. This collar keeps your neck from moving while you heal.  Do not take the collar off unless told by your doctor.  If you have long hair, keep it outside of the collar.  Ask your doctor before changing the position of your collar. You may need to change its position over time to make it more comfortable.  If you are allowed to take off the collar for cleaning or bathing, follow your doctor's  instructions on how to do it safely.  Keep your collar clean by wiping it with mild soap and water. Dry it completely. If the collar has removable pads, remove them every 1-2 days to hand wash them with soap and water. Allow them to air dry. They should be dry before you wear them in the collar.  Do not drive while wearing the collar.  Only take medicine as told by your doctor.  Keep all doctor visits as told.  Keep all physical therapy visits as told.  Adjust  your work station so that you have good posture while you work.  Avoid positions and activities that make your problems worse.  Warm up and stretch before being active. GET HELP IF:  Your pain is not controlled with medicine.  You cannot take less pain medicine over time as planned.  Your activity level does not improve as expected. GET HELP RIGHT AWAY IF:   You are bleeding.  Your stomach is upset.  You have an allergic reaction to your medicine.  You develop new problems that you cannot explain.  You lose feeling (become numb) or you cannot move any part of your body (paralysis).  You have tingling or weakness in any part of your body.  Your symptoms get worse. Symptoms include:  Pain, soreness, stiffness, puffiness (swelling), or a burning feeling in your neck.  Pain when your neck is touched.  Shoulder or upper back pain.  Limited ability to move your neck.  Headache.  Dizziness.  Your hands or arms feel week, lose feeling, or tingle.  Muscle spasms.  Difficulty swallowing or chewing. MAKE SURE YOU:   Understand these instructions.  Will watch your condition.  Will get help right away if you are not doing well or get worse. Document Released: 01/30/2008 Document Revised: 04/15/2013 Document Reviewed: 02/18/2013 [facility] Patient Information 2015 [place], [place]. This information is not intended to replace advice given to you by your health care provider. Make sure you discuss any questions you have with your health care provider.

## 2016-07-28 ENCOUNTER — Emergency Department: Payer: Commercial Managed Care - HMO

## 2016-07-28 ENCOUNTER — Encounter: Payer: Self-pay | Admitting: Emergency Medicine

## 2016-07-28 ENCOUNTER — Emergency Department
Admission: EM | Admit: 2016-07-28 | Discharge: 2016-07-28 | Disposition: A | Discharge status: Home / Self Care | Payer: Commercial Managed Care - HMO | Attending: Emergency Medicine | Admitting: Emergency Medicine

## 2016-07-28 DIAGNOSIS — S52021B Displaced fracture of olecranon process without intraarticular extension of right ulna, initial encounter for open fracture type I or II: Secondary | ICD-10-CM | POA: Insufficient documentation

## 2016-07-28 DIAGNOSIS — Y999 Unspecified external cause status: Secondary | ICD-10-CM | POA: Insufficient documentation

## 2016-07-28 DIAGNOSIS — Z23 Encounter for immunization: Secondary | ICD-10-CM | POA: Diagnosis not present

## 2016-07-28 DIAGNOSIS — S59912A Unspecified injury of left forearm, initial encounter: Secondary | ICD-10-CM | POA: Diagnosis present

## 2016-07-28 DIAGNOSIS — R51 Headache: Secondary | ICD-10-CM | POA: Insufficient documentation

## 2016-07-28 DIAGNOSIS — S52502A Unspecified fracture of the lower end of left radius, initial encounter for closed fracture: Secondary | ICD-10-CM | POA: Insufficient documentation

## 2016-07-28 DIAGNOSIS — W11XXXA Fall on and from ladder, initial encounter: Secondary | ICD-10-CM | POA: Diagnosis not present

## 2016-07-28 DIAGNOSIS — S51011A Laceration without foreign body of right elbow, initial encounter: Secondary | ICD-10-CM | POA: Diagnosis not present

## 2016-07-28 DIAGNOSIS — I10 Essential (primary) hypertension: Secondary | ICD-10-CM | POA: Diagnosis not present

## 2016-07-28 DIAGNOSIS — S0990XA Unspecified injury of head, initial encounter: Secondary | ICD-10-CM

## 2016-07-28 DIAGNOSIS — S0093XA Contusion of unspecified part of head, initial encounter: Secondary | ICD-10-CM | POA: Diagnosis not present

## 2016-07-28 DIAGNOSIS — Y929 Unspecified place or not applicable: Secondary | ICD-10-CM | POA: Diagnosis not present

## 2016-07-28 DIAGNOSIS — Y9389 Activity, other specified: Secondary | ICD-10-CM | POA: Diagnosis not present

## 2016-07-28 MED ORDER — ONDANSETRON 4 MG PO TBDP: ORAL_TABLET | ORAL | 0 refills | Status: DC

## 2016-07-28 MED ORDER — DOCUSATE SODIUM 100 MG PO CAPS: ORAL_CAPSULE | ORAL | 0 refills | Status: DC

## 2016-07-28 MED ORDER — CEPHALEXIN 500 MG PO CAPS
500.0000 mg | ORAL_CAPSULE | Freq: Once | ORAL | Status: AC
  Administered 2016-07-28: 500 mg via ORAL
  Filled 2016-07-28: qty 1

## 2016-07-28 MED ORDER — OXYCODONE-ACETAMINOPHEN 5-325 MG PO TABS
2.0000 | ORAL_TABLET | Freq: Once | ORAL | Status: AC
  Administered 2016-07-28: 2 via ORAL
  Filled 2016-07-28: qty 2

## 2016-07-28 MED ORDER — OXYCODONE-ACETAMINOPHEN 5-325 MG PO TABS: 1.0000 | ORAL_TABLET | ORAL | 0 refills | Status: DC | PRN

## 2016-07-28 MED ORDER — CEPHALEXIN 500 MG PO CAPS: 500.0000 mg | ORAL_CAPSULE | Freq: Two times a day (BID) | ORAL | 0 refills | Status: DC

## 2016-07-28 MED ORDER — TETANUS-DIPHTH-ACELL PERTUSSIS 5-2.5-18.5 LF-MCG/0.5 IM SUSP
0.5000 mL | Freq: Once | INTRAMUSCULAR | Status: AC
  Administered 2016-07-28: 0.5 mL via INTRAMUSCULAR
  Filled 2016-07-28: qty 0.5

## 2016-07-28 NOTE — ED Notes (Signed)
Right elbow irrigated with Ns.

## 2016-07-28 NOTE — ED Notes (Signed)
Pt alert c-collar in place 

## 2016-07-28 NOTE — ED Notes (Signed)
Pt fell off a roof/ladder when foot slipped.  Pt fell apprx 3510ft.  States loc. Pt has a headache.  No vomiting.  Pt wearing c-collar on arrival to treatment room.  Pt also has pain in left wrist with swelling.  Pt has abrasion to left elbow and pain.  Pt denies neck or back pain.  Pt alert. Speech clear.  Family with pt.

## 2016-07-28 NOTE — Discharge Instructions (Signed)
As we discussed, you need to follow up on Monday with Dr. Rosita KeaMenz (see the contact information below).  He may want to perform surgery on Tuesday to fix your wrist.  Your small elbow (olecranon) fracture on your right arm should heal without a splint.  Please take the full course of antibiotics as prescribed.  You will need to have the staples in your right elbow removed in about 7-10 days (your primary care doctor, orthopedic doctor, or an urgent care center can remove these).    Return to the Emergency Department if you develop new or worsening symptoms that concern you.

## 2016-07-28 NOTE — ED Provider Notes (Addendum)
[facility] Emergency Department Provider Note  ____________________________________________   First MD Initiated Contact with Patient 07/28/16 1920     (approximate)  I have reviewed the triage vital signs and the nursing notes.   HISTORY  Chief Complaint Head Injury and Fall    HPI Timothy Wilkins is a 60 y.o. male with a history of uncontrolled hypertension who presents for evaluation after a fall of about 10 feet off of a ladder.  He was just getting up on the roof when his foot slipped and he fell backwards, striking his head on the ground and causing a brief loss of consciousness.  He has a moderate headache at this time but is alert and oriented 3.  He has no neck pain.  He was placed in a c-collar upon arrival to triage.  He is also complaining of moderate pain in his left wrist that is worse with movement and mild to moderate pain in his right elbow that is worse with movement and some bleeding from an open laceration of the elbow.  Rest makes all of his injuries better.  The onset of all of these symptoms was acute.Denies nausea, vomiting, chest pain, shortness of breath, abdominal pain.  He does not know when he had his last tetanus vaccination.   Past Medical History:  Diagnosis Date  . Hypertension     There are no active problems to display for this patient.   History reviewed. No pertinent surgical history.  Prior to Admission medications   Medication Sig Start Date End Date Taking? Authorizing Provider  cephALEXin (KEFLEX) 500 MG capsule Take 1 capsule (500 mg total) by mouth 2 (two) times daily. 07/28/16   Loleta Roseory Hania Cerone, MD  cyclobenzaprine (FLEXERIL) 10 MG tablet Take 1 tablet (10 mg total) by mouth every 8 (eight) hours as needed for muscle spasms. 01/29/15   Evangeline Dakinharles M Beers, PA-C  docusate sodium (COLACE) 100 MG capsule Take 1 tablet once or twice daily as needed for constipation while taking narcotic pain medicine 07/28/16   Loleta Roseory Linn Goetze,  MD  HYDROcodone-acetaminophen (NORCO) 5-325 MG per tablet Take 1-2 tablets by mouth every 4 (four) hours as needed for moderate pain. 01/29/15   Charmayne Sheerharles M Beers, PA-C  ibuprofen (ADVIL,MOTRIN) 800 MG tablet Take 1 tablet (800 mg total) by mouth every 8 (eight) hours as needed. 01/29/15   Charmayne Sheerharles M Beers, PA-C  lisinopril (PRINIVIL,ZESTRIL) 10 MG tablet Take 10 mg by mouth daily.    Historical Provider, MD  ondansetron (ZOFRAN ODT) 4 MG disintegrating tablet Allow 1-2 tablets to dissolve in your mouth every 8 hours as needed for nausea/vomiting 07/28/16   Loleta Roseory Joylynn Defrancesco, MD  oxyCODONE-acetaminophen (ROXICET) 5-325 MG tablet Take 1-2 tablets by mouth every 4 (four) hours as needed for severe pain. 07/28/16   Loleta Roseory Ashaad Gaertner, MD    Allergies Patient has no known allergies.  History reviewed. No pertinent family history.  Social History Social History  Substance Use Topics  . Smoking status: Never Smoker  . Smokeless tobacco: Never Used  . Alcohol use No    Review of Systems Constitutional: No fever/chills Eyes: No visual changes. ENT: No sore throat. Cardiovascular: Denies chest pain. Respiratory: Denies shortness of breath. Gastrointestinal: No abdominal pain.  No nausea, no vomiting.  No diarrhea.  No constipation. Genitourinary: Negative for dysuria. Musculoskeletal: Headache with a hematoma on the back of the head.  No neck pain.  Pain and swelling in his left wrist and pain in his right elbow  with a laceration Skin: Negative for rash. Neurological: Negative for headaches, focal weakness or numbness.  10-point ROS otherwise negative.  ____________________________________________   PHYSICAL EXAM:  VITAL SIGNS: ED Triage Vitals  Enc Vitals Group     BP 07/28/16 1752 (!) 185/94     Pulse Rate 07/28/16 1752 88     Resp 07/28/16 1752 18     Temp 07/28/16 1752 98.9 F (37.2 C)     Temp Source 07/28/16 1752 Oral     SpO2 07/28/16 1752 98 %     Weight 07/28/16 1753 192 lb (87.1 kg)      Height 07/28/16 1753 6' (1.829 m)     Head Circumference --      Peak Flow --      Pain Score 07/28/16 1753 5     Pain Loc --      Pain Edu? --      Excl. in GC? --     Constitutional: Alert and oriented. Well appearing and in no acute distress. Eyes: Conjunctivae are normal. PERRL. EOMI. Head: Hematoma and superficial abrasion occipitally but with no significant tenderness to palpation and no laceration requiring closure. Nose: No congestion/rhinnorhea. Mouth/Throat: Mucous membranes are moist.  Oropharynx non-erythematous. Neck: No stridor.  No meningeal signs.  No cervical spine tenderness to palpation and no pain or tenderness with flexion and extension of his head and neck. Cardiovascular: Normal rate, regular rhythm. Good peripheral circulation. Grossly normal heart sounds. Respiratory: Normal respiratory effort.  No retractions. Lungs CTAB.  Chest wall tenderness to palpation. Gastrointestinal: Soft and nontender. No distention.  Musculoskeletal: Patient has obvious deformity with some swelling to his left wrist.  He is neurovascularly intact and able to wiggle his fingers.  Severe pain with attempts at flexion of the wrist but otherwise his lungs he does not move that he feels okay.  Additionally he has a 1 cm ragged laceration to his elbow and some tenderness to palpation but no significant swelling.  He is able to actively flex and extend his elbow without significant discomfort.  He has no other musculoskeletal abnormalities identified and is able to flex and extend the major joints and muscle groups in his lower extremities without any difficulty. Neurologic:  Normal speech and language. No gross focal neurologic deficits are appreciated.  Skin:  Skin is warm, dry and intact. No rash noted. Psychiatric: Mood and affect are normal. Speech and behavior are normal. GCS 15.  ____________________________________________   LABS (all labs ordered are listed, but only abnormal results  are displayed)  Labs Reviewed - No data to display ____________________________________________  EKG  None - EKG not ordered by ED physician ____________________________________________  RADIOLOGY   Dg Elbow Complete Right  Result Date: 07/28/2016 CLINICAL DATA:  Pt fell off a roof/ladder approx. 52ft today. Pt has swelling and some deformity of left wrist and pain in right elbow. No previous. EXAM: RIGHT ELBOW - COMPLETE 3+ VIEW COMPARISON:  None. FINDINGS: There is a comminuted fracture of the olecranon at the triceps insertion with soft tissue swelling and soft tissue air, the latter consistent with an associated laceration. No other fractures. The elbow joint is normally spaced and aligned. No joint effusion. IMPRESSION: Comminuted fracture of the olecranon at the triceps insertion associated with an overlying laceration as well as soft tissue swelling and air. Electronically Signed   By: Amie Portland M.D.   On: 07/28/2016 18:27   Dg Wrist Complete Left  Result Date: 07/28/2016 CLINICAL DATA:  Patient  fell approximate 10 feet complaining of left wrist pain and deformity. EXAM: LEFT WRIST - COMPLETE 3+ VIEW COMPARISON:  None. FINDINGS: There is a comminuted fracture of the distal radius. Primary fracture is transverse across the metaphysis. There is secondary sagittally oriented fracture across the epiphysis intersecting the distal radial articular surface between the lunate and scaphoid facets. Buckling of the dorsal cortex of the distal radius leads to mild dorsal angulation of the distal articular surface of approximately 8 degrees. Fracture is also mildly impacted. No other fractures. There is diffuse surrounding soft tissue swelling. No dislocation. IMPRESSION: 1. Comminuted fracture of the distal radius as detailed above. No dislocation. Electronically Signed   By: Amie Portlandavid  Ormond M.D.   On: 07/28/2016 18:29   Ct Head Wo Contrast  Result Date: 07/28/2016 CLINICAL DATA:  Fall from  ladder. Headache. Neck pain. Loss of consciousness. EXAM: CT HEAD WITHOUT CONTRAST CT CERVICAL SPINE WITHOUT CONTRAST TECHNIQUE: Multidetector CT imaging of the head and cervical spine was performed following the standard protocol without intravenous contrast. Multiplanar CT image reconstructions of the cervical spine were also generated. COMPARISON:  None. FINDINGS: CT HEAD FINDINGS Brain: No evidence of parenchymal hemorrhage or extra-axial fluid collection. No mass lesion, mass effect, or midline shift. No CT evidence of acute infarction. Intracranial atherosclerosis. Cerebral volume is age appropriate. No ventriculomegaly. Vascular: No hyperdense vessel or unexpected calcification. Skull: No evidence of calvarial fracture. Sinuses/Orbits: The visualized paranasal sinuses are essentially clear. Other: Small bilateral posterior scalp hematoma, asymmetric to the right. The mastoid air cells are unopacified. CT CERVICAL SPINE FINDINGS Alignment: Straightening of the cervical spine. Long segment mild levocurvature of the cervical spine. Minimal 2 mm anterolisthesis at C3-4. Otherwise no subluxation. Dens is well positioned between the lateral masses of C1. Skull base and vertebrae: No acute fracture. No primary bone lesion or focal pathologic process. 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 Soft tissues and spinal canal: No prevertebral fluid or swelling. No visible canal hematoma. Disc levels: Moderate degenerative disc disease in the mid to lower cervical spine, most prominent at C4-5, C5-6 and C6-7. Mild-to-moderate right and mild left facet arthropathy. Moderate degenerate foraminal stenosis on the right at C3-4, C4-5 and C5-6. Mild degenerative foraminal stenosis on the left at C6-7. Upper chest: Negative. Other: Visualized mastoid air cells appear clear. Partially calcified 1.6 cm left thyroid lobe nodule. No pathologically enlarged cervical nodes. IMPRESSION: 1. Small bilateral posterior scalp  hematoma . 2. No evidence of acute intracranial abnormality. No evidence of calvarial fracture. 3. No cervical spine fracture . 4. Moderate degenerative changes in the mid to lower cervical spine as described . 5. Minimal 2 mm anterolisthesis at C3-4, favor degenerative . 6. Partially calcified 1.6 cm left thyroid lobe nodule, for which correlation with thyroid ultrasound is advised on a short term outpatient basis. This follows ACR consensus guidelines: Managing Incidental Thyroid Nodules Detected on Imaging: White Paper of the ACR Incidental Thyroid Findings Committee. J Am Coll Radiol 2015; 12:143-150. Electronically Signed   By: Delbert PhenixJason A Poff M.D.   On: 07/28/2016 19:07   Ct Cervical Spine Wo Contrast  Result Date: 07/28/2016 CLINICAL DATA:  Fall from ladder. Headache. Neck pain. Loss of consciousness. EXAM: CT HEAD WITHOUT CONTRAST CT CERVICAL SPINE WITHOUT CONTRAST TECHNIQUE: Multidetector CT imaging of the head and cervical spine was performed following the standard protocol without intravenous contrast. Multiplanar CT image reconstructions of the cervical spine were also generated. COMPARISON:  None. FINDINGS: CT HEAD FINDINGS Brain: No evidence of  parenchymal hemorrhage or extra-axial fluid collection. No mass lesion, mass effect, or midline shift. No CT evidence of acute infarction. Intracranial atherosclerosis. Cerebral volume is age appropriate. No ventriculomegaly. Vascular: No hyperdense vessel or unexpected calcification. Skull: No evidence of calvarial fracture. Sinuses/Orbits: The visualized paranasal sinuses are essentially clear. Other: Small bilateral posterior scalp hematoma, asymmetric to the right. The mastoid air cells are unopacified. CT CERVICAL SPINE FINDINGS Alignment: Straightening of the cervical spine. Long segment mild levocurvature of the cervical spine. Minimal 2 mm anterolisthesis at C3-4. Otherwise no subluxation. Dens is well positioned between the lateral masses of C1. Skull  base and vertebrae: No acute fracture. No primary bone lesion or focal pathologic process. 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 Soft tissues and spinal canal: No prevertebral fluid or swelling. No visible canal hematoma. Disc levels: Moderate degenerative disc disease in the mid to lower cervical spine, most prominent at C4-5, C5-6 and C6-7. Mild-to-moderate right and mild left facet arthropathy. Moderate degenerate foraminal stenosis on the right at C3-4, C4-5 and C5-6. Mild degenerative foraminal stenosis on the left at C6-7. Upper chest: Negative. Other: Visualized mastoid air cells appear clear. Partially calcified 1.6 cm left thyroid lobe nodule. No pathologically enlarged cervical nodes. IMPRESSION: 1. Small bilateral posterior scalp hematoma . 2. No evidence of acute intracranial abnormality. No evidence of calvarial fracture. 3. No cervical spine fracture . 4. Moderate degenerative changes in the mid to lower cervical spine as described . 5. Minimal 2 mm anterolisthesis at C3-4, favor degenerative . 6. Partially calcified 1.6 cm left thyroid lobe nodule, for which correlation with thyroid ultrasound is advised on a short term outpatient basis. This follows ACR consensus guidelines: Managing Incidental Thyroid Nodules Detected on Imaging: White Paper of the ACR Incidental Thyroid Findings Committee. J Am Coll Radiol 2015; 12:143-150. Electronically Signed   By: Delbert Phenix M.D.   On: 07/28/2016 19:07    ____________________________________________   PROCEDURES  Procedure(s) performed:   .Splint Application Date/Time: 07/28/2016 10:30 PM Performed by: Loleta Rose Authorized by: Loleta Rose   Consent:    Consent obtained:  Verbal   Consent given by:  Patient Pre-procedure details:    Sensation:  Normal Procedure details:    Laterality:  Left   Location:  Wrist   Wrist:  L wrist   Cast type:  Short arm   Splint type:  Sugar tong   Supplies:   Ortho-Glass Post-procedure details:    Pain:  Unchanged   Patient tolerance of procedure:  Tolerated well, no immediate complications Comments:     Applied by ED tech   .Marland KitchenLaceration Repair Date/Time: 07/28/2016 10:00 PM Performed by: Loleta Rose Authorized by: Loleta Rose   Consent:    Consent obtained:  Verbal   Consent given by:  Patient Anesthesia (see MAR for exact dosages):    Anesthesia method:  None Laceration details:    Location:  Shoulder/arm   Shoulder/arm location:  R elbow   Length (cm):  1.5 Repair type:    Repair type:  Simple Pre-procedure details:    Preparation:  Imaging obtained to evaluate for foreign bodies Exploration:    Hemostasis achieved with:  Direct pressure   Wound exploration: wound explored through full range of motion and entire depth of wound probed and visualized     Contaminated: no   Treatment:    Amount of cleaning:  Extensive   Irrigation solution:  Sterile saline   Visualized foreign bodies/material removed: no   Skin repair:  Repair method:  Staples   Number of staples:  4 Post-procedure details:    Dressing:  Sterile dressing   Patient tolerance of procedure:  Tolerated well, no immediate complications     Critical Care performed: No ____________________________________________   INITIAL IMPRESSION / ASSESSMENT AND PLAN / ED COURSE  Pertinent labs & imaging results that were available during my care of the patient were reviewed by me and considered in my medical decision making (see chart for details).  I discussed the results of the x-rays with Dr. Rosita Kea.  He recommended prophylactic antibiotics given the open laceration on the right elbow and the small chip of bone, but stated that the olecranon fracture does not require a splint.  He feels that the patient will likely need an operative fix for his left wrist and we will splint him tonight and the patient will follow up first thing in the morning on Monday in the  orthopedics office likely for a procedure on Tuesday.  I advised the patient of this plan but explained it was open to change based on his evaluation on Monday.  I am giving him a tetanus vaccination.  I have stapled his right elbow laceration.  Putting him on Keflex for the prophylactic antibiotics.  I gave my usual and customary return precautions.      Clinical Course as of Jul 28 2336  Sat Jul 28, 2016  1830 DG Elbow Complete Right [CF]    Clinical Course User Index [CF] Loleta Rose, MD    ____________________________________________  FINAL CLINICAL IMPRESSION(S) / ED DIAGNOSES  Final diagnoses:  Fall from ladder, initial encounter  Injury of head, initial encounter  Closed fracture of distal end of left radius, unspecified fracture morphology, initial encounter  Elbow laceration, right, initial encounter  Type I or II open fracture of right olecranon, initial encounter     MEDICATIONS GIVEN DURING THIS VISIT:  Medications  oxyCODONE-acetaminophen (PERCOCET/ROXICET) 5-325 MG per tablet 2 tablet (2 tablets Oral Given 07/28/16 1943)  Tdap (BOOSTRIX) injection 0.5 mL (0.5 mLs Intramuscular Given 07/28/16 1944)  cephALEXin (KEFLEX) capsule 500 mg (500 mg Oral Given 07/28/16 2011)     NEW OUTPATIENT MEDICATIONS STARTED DURING THIS VISIT:  Discharge Medication List as of 07/28/2016  9:08 PM    START taking these medications   Details  cephALEXin (KEFLEX) 500 MG capsule Take 1 capsule (500 mg total) by mouth 2 (two) times daily., Starting Sat 07/28/2016, Print    docusate sodium (COLACE) 100 MG capsule Take 1 tablet once or twice daily as needed for constipation while taking narcotic pain medicine, Print    ondansetron (ZOFRAN ODT) 4 MG disintegrating tablet Allow 1-2 tablets to dissolve in your mouth every 8 hours as needed for nausea/vomiting, Print    oxyCODONE-acetaminophen (ROXICET) 5-325 MG tablet Take 1-2 tablets by mouth every 4 (four) hours as needed for severe pain.,  Starting Sat 07/28/2016, Print        Discharge Medication List as of 07/28/2016  9:08 PM      Discharge Medication List as of 07/28/2016  9:08 PM       Note:  This document was prepared using Dragon voice recognition software and may include unintentional dictation errors.    Loleta Rose, MD 07/28/16 2115    Loleta Rose, MD 07/28/16 2337

## 2016-07-28 NOTE — ED Triage Notes (Signed)
Pt states he was up on the ladder blowing off leaves when his foot slipped off the ladder and fell onto his head about 8910ft.  Pt states he did briefly lose consciousness.  Also hurt right elbow and was bleeding which is now controlled and also left wrist pain, patient had wrapped in ACE wrap, swelling also noted to left wrist.

## 2016-07-28 NOTE — ED Notes (Signed)
Pt reports he takes blood pressure meds at night time

## 2016-07-30 ENCOUNTER — Encounter
Admission: RE | Admit: 2016-07-30 | Discharge: 2016-07-30 | Disposition: A | Discharge status: Home / Self Care | Payer: Commercial Managed Care - HMO | Source: Ambulatory Visit | Attending: Orthopedic Surgery | Admitting: Orthopedic Surgery

## 2016-07-30 DIAGNOSIS — S52572A Other intraarticular fracture of lower end of left radius, initial encounter for closed fracture: Secondary | ICD-10-CM | POA: Diagnosis present

## 2016-07-30 DIAGNOSIS — Y9389 Activity, other specified: Secondary | ICD-10-CM | POA: Diagnosis not present

## 2016-07-30 DIAGNOSIS — Y998 Other external cause status: Secondary | ICD-10-CM | POA: Diagnosis not present

## 2016-07-30 DIAGNOSIS — W132XXA Fall from, out of or through roof, initial encounter: Secondary | ICD-10-CM | POA: Diagnosis not present

## 2016-07-30 DIAGNOSIS — Y9289 Other specified places as the place of occurrence of the external cause: Secondary | ICD-10-CM | POA: Diagnosis not present

## 2016-07-30 DIAGNOSIS — I10 Essential (primary) hypertension: Secondary | ICD-10-CM | POA: Diagnosis not present

## 2016-07-30 LAB — BASIC METABOLIC PANEL
Anion gap: 6 (ref 5–15)
BUN: 15 mg/dL (ref 6–20)
CHLORIDE: 103 mmol/L (ref 101–111)
CO2: 28 mmol/L (ref 22–32)
Calcium: 8.9 mg/dL (ref 8.9–10.3)
Creatinine, Ser: 1.17 mg/dL (ref 0.61–1.24)
GFR calc Af Amer: >60 mL/min (ref >60)
Glucose, Bld: 181 mg/dL — ABNORMAL HIGH (ref 65–99)
POTASSIUM: 3.9 mmol/L (ref 3.5–5.1)
SODIUM: 137 mmol/L (ref 135–145)

## 2016-07-30 NOTE — Pre-Procedure Instructions (Signed)
Dr. Karlton LemonKarenz looked at EKG, no other EKG available to compare, no cardiac history, OK to proceed per Dr. Karlton LemonKarenz.

## 2016-07-30 NOTE — Patient Instructions (Signed)
  Your procedure is scheduled on:11:00AM TOMORROW.  Report to Same Day Surgery.  Remember: Instructions that are not followed completely may result in serious medical risk, up to and including death, or upon the discretion of your surgeon and anesthesiologist your surgery may need to be rescheduled.    _x___ 1. Do not eat food or drink liquids after midnight. No gum chewing or hard candies.     ____ 2. No Alcohol for 24 hours before or after surgery.   ____ 3. Bring all medications with you on the day of surgery if instructed.    __x__ 4. Notify your doctor if there is any change in your medical condition     (cold, fever, infections).    _____ 5. No smoking 24 hours prior to surgery.     Do not wear jewelry, make-up, hairpins, clips or nail polish.  Do not wear lotions, powders, or perfumes.   Do not shave 48 hours prior to surgery. Men may shave face and neck.  Do not bring valuables to the hospital.    [facility] any belongings or valuables.               Contacts, dentures or bridgework may not be worn into surgery.  Leave your suitcase in the car. After surgery it may be brought to your room.  For patients admitted to the hospital, discharge time is determined by your treatment team.   Patients discharged the day of surgery will not be allowed to drive home.    Please read over the following fact sheets that you were given:   [facility] Pre[facility]  __X__ Take these medicines the morning of surgery with A SIP OF WATER:     ____ Fleet Enema (as directed)   _X___ Use CHG Soap as directed on instruction sheet  ____ Use inhalers on the day of surgery and bring to hospital day of surgery  ____ Stop metformin 2 days prior to surgery    ____ Take 1/2 of usual insulin dose the night before surgery and none on the morning of  surgery.   ____ Stop Coumadin/Plavix/aspirin on DOES NOT APPLY.  _X___ Stop Anti-inflammatories such as Advil,  Aleve, Ibuprofen, Motrin, Naproxen, Naprosyn, Goodies powders or aspirin products. OK to take Tylenol OR  Oxycodone.  ____ Stop supplements until after surgery.    ____ Bring C-Pap to the hospital.

## 2016-07-31 ENCOUNTER — Encounter
Admission: RE | Disposition: A | Discharge status: Home / Self Care | Payer: Self-pay | Source: Ambulatory Visit | Attending: Orthopedic Surgery

## 2016-07-31 ENCOUNTER — Ambulatory Visit: Payer: Commercial Managed Care - HMO | Admitting: Anesthesiology

## 2016-07-31 ENCOUNTER — Ambulatory Visit
Admission: RE | Admit: 2016-07-31 | Discharge: 2016-07-31 | Disposition: A | Discharge status: Home / Self Care | Payer: Commercial Managed Care - HMO | Source: Ambulatory Visit | Attending: Orthopedic Surgery | Admitting: Orthopedic Surgery

## 2016-07-31 ENCOUNTER — Encounter: Payer: Self-pay | Admitting: *Deleted

## 2016-07-31 ENCOUNTER — Ambulatory Visit: Payer: Commercial Managed Care - HMO

## 2016-07-31 DIAGNOSIS — I10 Essential (primary) hypertension: Secondary | ICD-10-CM | POA: Insufficient documentation

## 2016-07-31 DIAGNOSIS — W132XXA Fall from, out of or through roof, initial encounter: Secondary | ICD-10-CM | POA: Insufficient documentation

## 2016-07-31 DIAGNOSIS — Y9289 Other specified places as the place of occurrence of the external cause: Secondary | ICD-10-CM | POA: Insufficient documentation

## 2016-07-31 DIAGNOSIS — Z8781 Personal history of (healed) traumatic fracture: Secondary | ICD-10-CM

## 2016-07-31 DIAGNOSIS — Y9389 Activity, other specified: Secondary | ICD-10-CM | POA: Insufficient documentation

## 2016-07-31 DIAGNOSIS — S52572A Other intraarticular fracture of lower end of left radius, initial encounter for closed fracture: Secondary | ICD-10-CM | POA: Diagnosis not present

## 2016-07-31 DIAGNOSIS — Y998 Other external cause status: Secondary | ICD-10-CM | POA: Insufficient documentation

## 2016-07-31 DIAGNOSIS — Z9889 Other specified postprocedural states: Secondary | ICD-10-CM

## 2016-07-31 HISTORY — PX: ORIF WRIST FRACTURE: SHX2133

## 2016-07-31 SURGERY — OPEN REDUCTION INTERNAL FIXATION (ORIF) WRIST FRACTURE
Anesthesia: General | Laterality: Left | Wound class: Clean

## 2016-07-31 MED ORDER — LIDOCAINE HCL (CARDIAC) 20 MG/ML IV SOLN
INTRAVENOUS | Status: DC | PRN
  Administered 2016-07-31: 100 mg via INTRAVENOUS

## 2016-07-31 MED ORDER — SODIUM CHLORIDE 0.9 % IV SOLN
INTRAVENOUS | Status: DC
  Administered 2016-07-31: 12:00:00 via INTRAVENOUS

## 2016-07-31 MED ORDER — ONDANSETRON HCL 4 MG/2ML IJ SOLN: 4.0000 mg | Freq: Once | INTRAMUSCULAR | Status: DC | PRN

## 2016-07-31 MED ORDER — HYDROCODONE-ACETAMINOPHEN 5-325 MG PO TABS
1.0000 | ORAL_TABLET | Freq: Four times a day (QID) | ORAL | 0 refills | Status: DC | PRN
Start: 2016-07-31 — End: 2019-06-25

## 2016-07-31 MED ORDER — PROPOFOL 10 MG/ML IV BOLUS
INTRAVENOUS | Status: DC | PRN
  Administered 2016-07-31: 200 mg via INTRAVENOUS

## 2016-07-31 MED ORDER — ONDANSETRON HCL 4 MG/2ML IJ SOLN: 4.0000 mg | Freq: Four times a day (QID) | INTRAMUSCULAR | Status: DC | PRN

## 2016-07-31 MED ORDER — HYDRALAZINE HCL 20 MG/ML IJ SOLN
INTRAMUSCULAR | Status: AC
  Administered 2016-07-31: 10 mg via INTRAVENOUS
  Filled 2016-07-31: qty 1

## 2016-07-31 MED ORDER — FENTANYL CITRATE (PF) 100 MCG/2ML IJ SOLN
25.0000 ug | INTRAMUSCULAR | Status: AC | PRN
  Administered 2016-07-31 (×6): 25 ug via INTRAVENOUS

## 2016-07-31 MED ORDER — METOCLOPRAMIDE HCL 5 MG/ML IJ SOLN: 5.0000 mg | Freq: Three times a day (TID) | INTRAMUSCULAR | Status: DC | PRN

## 2016-07-31 MED ORDER — HYDROCODONE-ACETAMINOPHEN 5-325 MG PO TABS
ORAL_TABLET | ORAL | Status: AC
  Filled 2016-07-31: qty 1

## 2016-07-31 MED ORDER — FENTANYL CITRATE (PF) 100 MCG/2ML IJ SOLN
INTRAMUSCULAR | Status: DC | PRN
  Administered 2016-07-31 (×2): 50 ug via INTRAVENOUS
  Administered 2016-07-31: 100 ug via INTRAVENOUS

## 2016-07-31 MED ORDER — NEOMYCIN-POLYMYXIN B GU 40-200000 IR SOLN
Status: DC | PRN
  Administered 2016-07-31: 2 mL

## 2016-07-31 MED ORDER — HYDRALAZINE HCL 20 MG/ML IJ SOLN
10.0000 mg | Freq: Once | INTRAMUSCULAR | Status: AC
  Administered 2016-07-31: 10 mg via INTRAVENOUS

## 2016-07-31 MED ORDER — FAMOTIDINE 20 MG PO TABS
ORAL_TABLET | ORAL | Status: AC
  Administered 2016-07-31: 20 mg via ORAL
  Filled 2016-07-31: qty 1

## 2016-07-31 MED ORDER — LABETALOL HCL 5 MG/ML IV SOLN
INTRAVENOUS | Status: AC
  Administered 2016-07-31: 10 mg via INTRAVENOUS
  Filled 2016-07-31: qty 4

## 2016-07-31 MED ORDER — FAMOTIDINE 20 MG PO TABS
20.0000 mg | ORAL_TABLET | Freq: Once | ORAL | Status: AC
  Administered 2016-07-31: 20 mg via ORAL

## 2016-07-31 MED ORDER — MIDAZOLAM HCL 2 MG/2ML IJ SOLN
INTRAMUSCULAR | Status: DC | PRN
  Administered 2016-07-31: 2 mg via INTRAVENOUS

## 2016-07-31 MED ORDER — HYDROCODONE-ACETAMINOPHEN 5-325 MG PO TABS
1.0000 | ORAL_TABLET | ORAL | Status: DC | PRN
  Administered 2016-07-31: 1 via ORAL

## 2016-07-31 MED ORDER — METOCLOPRAMIDE HCL 10 MG PO TABS: 5.0000 mg | ORAL_TABLET | Freq: Three times a day (TID) | ORAL | Status: DC | PRN

## 2016-07-31 MED ORDER — LACTATED RINGERS IV SOLN
INTRAVENOUS | Status: DC | PRN
  Administered 2016-07-31: 13:00:00 via INTRAVENOUS

## 2016-07-31 MED ORDER — CEFAZOLIN SODIUM-DEXTROSE 2-4 GM/100ML-% IV SOLN
INTRAVENOUS | Status: AC
  Filled 2016-07-31: qty 100

## 2016-07-31 MED ORDER — SODIUM CHLORIDE 0.9 % IV SOLN: INTRAVENOUS | Status: DC

## 2016-07-31 MED ORDER — CEFAZOLIN SODIUM-DEXTROSE 2-4 GM/100ML-% IV SOLN
2.0000 g | Freq: Once | INTRAVENOUS | Status: AC
  Administered 2016-07-31: 2 g via INTRAVENOUS

## 2016-07-31 MED ORDER — ONDANSETRON HCL 4 MG/2ML IJ SOLN
INTRAMUSCULAR | Status: DC | PRN
  Administered 2016-07-31: 4 mg via INTRAVENOUS

## 2016-07-31 MED ORDER — FENTANYL CITRATE (PF) 100 MCG/2ML IJ SOLN
INTRAMUSCULAR | Status: AC
  Administered 2016-07-31: 25 ug via INTRAVENOUS
  Filled 2016-07-31: qty 2

## 2016-07-31 MED ORDER — ONDANSETRON HCL 4 MG PO TABS: 4.0000 mg | ORAL_TABLET | Freq: Four times a day (QID) | ORAL | Status: DC | PRN

## 2016-07-31 MED ORDER — LABETALOL HCL 5 MG/ML IV SOLN
10.0000 mg | INTRAVENOUS | Status: AC | PRN
  Administered 2016-07-31 (×2): 10 mg via INTRAVENOUS

## 2016-07-31 SURGICAL SUPPLY — 46 items
BANDAGE ACE 4X5 VEL STRL LF (GAUZE/BANDAGES/DRESSINGS) ×3 IMPLANT
BANDAGE ELASTIC 4 LF NS (GAUZE/BANDAGES/DRESSINGS) ×3 IMPLANT
BIT DRILL 2 FAST STEP (BIT) ×3 IMPLANT
BIT DRILL 2.5 CANN LNG (BIT) ×3 IMPLANT
BNDG ESMARK 4X12 TAN STRL LF (GAUZE/BANDAGES/DRESSINGS) ×3 IMPLANT
CANISTER SUCT 1200ML W/VALVE (MISCELLANEOUS) ×3 IMPLANT
CHLORAPREP W/TINT 26ML (MISCELLANEOUS) ×3 IMPLANT
CUFF TOURN 18 STER (MISCELLANEOUS) IMPLANT
DRAPE FLUOR MINI C-ARM 54X84 (DRAPES) ×3 IMPLANT
ELECT CAUTERY BLADE 6.4 (BLADE) ×3 IMPLANT
ELECT REM PT RETURN 9FT ADLT (ELECTROSURGICAL) ×3
ELECTRODE REM PT RTRN 9FT ADLT (ELECTROSURGICAL) ×1 IMPLANT
GAUZE PETRO XEROFOAM 1X8 (MISCELLANEOUS) ×3 IMPLANT
GAUZE SPONGE 4X4 12PLY STRL (GAUZE/BANDAGES/DRESSINGS) ×3 IMPLANT
GLOVE BIOGEL PI IND STRL 9 (GLOVE) ×1 IMPLANT
GLOVE BIOGEL PI INDICATOR 9 (GLOVE) ×2
GLOVE SURG SYN 9.0  PF PI (GLOVE) ×2
GLOVE SURG SYN 9.0 PF PI (GLOVE) ×1 IMPLANT
GOWN SRG 2XL LVL 4 RGLN SLV (GOWNS) ×1 IMPLANT
GOWN STRL NON-REIN 2XL LVL4 (GOWNS) ×2
GOWN STRL REUS W/ TWL LRG LVL3 (GOWN DISPOSABLE) ×1 IMPLANT
GOWN STRL REUS W/TWL LRG LVL3 (GOWN DISPOSABLE) ×2
K-WIRE 1.6 (WIRE) ×2
K-WIRE FX5X1.6XNS BN SS (WIRE) ×1
KIT RM TURNOVER STRD PROC AR (KITS) ×3 IMPLANT
KWIRE FX5X1.6XNS BN SS (WIRE) ×1 IMPLANT
NEEDLE FILTER BLUNT 18X 1/2SAF (NEEDLE) ×2
NEEDLE FILTER BLUNT 18X1 1/2 (NEEDLE) ×1 IMPLANT
NS IRRIG 500ML POUR BTL (IV SOLUTION) ×3 IMPLANT
PACK EXTREMITY ARMC (MISCELLANEOUS) ×3 IMPLANT
PAD CAST CTTN 4X4 STRL (SOFTGOODS) ×1 IMPLANT
PADDING CAST COTTON 4X4 STRL (SOFTGOODS) ×2
PEG SUBCHONDRAL SMOOTH 2.0X16 (Peg) ×3 IMPLANT
PEG SUBCHONDRAL SMOOTH 2.0X20 (Peg) ×6 IMPLANT
PEG SUBCHONDRAL SMOOTH 2.0X22 (Peg) ×6 IMPLANT
PEG SUBCHONDRAL SMOOTH 2.0X26 (Peg) ×3 IMPLANT
PLATE SHORT 21.6X48.9 NRRW LT (Plate) ×3 IMPLANT
SCREW CORT 3.5X10 LNG (Screw) ×6 IMPLANT
SCREW CORT 3.5X14 LNG (Screw) ×3 IMPLANT
SPLINT CAST 1 STEP 3X12 (MISCELLANEOUS) ×3 IMPLANT
STOCKINETTE STRL 4IN 9604848 (GAUZE/BANDAGES/DRESSINGS) ×3 IMPLANT
SUT ETHILON 4-0 (SUTURE) ×2
SUT ETHILON 4-0 FS2 18XMFL BLK (SUTURE) ×1
SUT VICRYL 3-0 27IN (SUTURE) ×3 IMPLANT
SUTURE ETHLN 4-0 FS2 18XMF BLK (SUTURE) ×1 IMPLANT
SYR 3ML LL SCALE MARK (SYRINGE) ×3 IMPLANT

## 2016-07-31 NOTE — Transfer of Care (Signed)
Immediate Anesthesia Transfer of Care Note  Patient: Timothy JohnsDon W Okuda  Procedure(s) Performed: Procedure(s): OPEN REDUCTION INTERNAL FIXATION (ORIF) WRIST FRACTURE (Left)  Patient Location: PACU  Anesthesia Type:General  Level of Consciousness: sedated  Airway & Oxygen Therapy: Patient Spontanous Breathing and Patient connected to face mask oxygen  Post-op Assessment: Report given to RN and Post -op Vital signs reviewed and stable  Post vital signs: Reviewed and stable  Last Vitals:  Vitals:   07/31/16 1119  BP: (!) 153/95  Pulse: 69  Resp: 16    Last Pain:  Vitals:   07/31/16 1119  TempSrc: Oral  PainSc: 2          Complications: No apparent anesthesia complications

## 2016-07-31 NOTE — H&P (Signed)
Reviewed paper H+P, will be scanned into chart. No changes noted.  

## 2016-07-31 NOTE — Op Note (Signed)
07/31/2016  2:57 PM  PATIENT:  Timothy Wilkins  60 y.o. male  PRE-OPERATIVE DIAGNOSIS:  OTHER CLOSED INTRAARTICULAR FRACTURE 3 or more fragments intra-articular left distal radius  POST-OPERATIVE DIAGNOSIS:  OTHER CLOSED INTRAARTICULAR FRACTURE  PROCEDURE:  Procedure(s): OPEN REDUCTION INTERNAL FIXATION (ORIF) WRIST FRACTURE (Left)  SURGEON: Leitha SchullerMichael J Dolce Sylvia, MD  ASSISTANTS: None  ANESTHESIA:   general  EBL:  Total I/O In: 500 [I.V.:500] Out: 1 [Blood:1]  BLOOD ADMINISTERED:none  DRAINS: none   LOCAL MEDICATIONS USED:  NONE  SPECIMEN:  No Specimen  DISPOSITION OF SPECIMEN:  N/A  COUNTS:  YES  TOURNIQUET:   34 minutes at 250 mmHg  IMPLANTS: [employer] hand [employer] short narrow DVR plate with multiple pegs and screws  DICTATION: .Dragon Dictation patient brought the operating room and after adequate general anesthesia was obtained, the left arm was prepped and draped in sterile fashion. After patient identification and timeout procedures were completed, tourniquet was inflated, and a volar approach was made to the distal radius center of the FCR tendon. The tendon sheath was incised and the tendon retracted radially protecting the vascular bundle. The pronator was elevated off the distal fragment and with fingertrap traction having been applied*the case length was restored but not volar angulation because this a volar plate was applied with distal fixation first getting this in the appropriate position pinning it and then placing the smooth pegs after these had all been placed the plate was brought to the shaft and restoration of volar tilt and radial cremation bad was obtained. The intra-articular fragments were manipulated through the fracture site and near anatomic alignment obtained. With the 3 cortical screws placed C and traction removed there was stable fracture pattern with no motion with passive range of motion of the hand under fluoroscopy the tourniquet was let down and  the wound closed with 3-0 Vicryl substantially and 4-0 nylon for the skin. Xeroform 4 x 4 web roll and a volar splint were applied  PLAN OF CARE: Discharge to home after PACU  PATIENT DISPOSITION:  PACU - hemodynamically stable.

## 2016-07-31 NOTE — Anesthesia Preprocedure Evaluation (Signed)
Anesthesia Evaluation  Patient identified by MRN, date of birth, ID band Patient awake    Reviewed: Allergy & Precautions, NPO status , Patient's Chart, lab work & pertinent test results  Airway Mallampati: III  TM Distance: >3 FB     Dental no notable dental hx. (+) Caps, Chipped   Pulmonary neg pulmonary ROS,    Pulmonary exam normal        Cardiovascular hypertension, Pt. on medications Normal cardiovascular exam     Neuro/Psych negative neurological ROS  negative psych ROS   GI/Hepatic negative GI ROS, Neg liver ROS,   Endo/Other  negative endocrine ROS  Renal/GU negative Renal ROS  negative genitourinary   Musculoskeletal Wrist Fx   Abdominal Normal abdominal exam  (+)   Peds negative pediatric ROS (+)  Hematology negative hematology ROS (+)   Anesthesia Other Findings   Reproductive/Obstetrics                             Anesthesia Physical Anesthesia Plan  ASA: II  Anesthesia Plan: General   Post-op Pain Management:    Induction: Intravenous  Airway Management Planned: LMA  Additional Equipment:   Intra-op Plan:   Post-operative Plan: Extubation in OR  Informed Consent: I have reviewed the patients History and Physical, chart, labs and discussed the procedure including the risks, benefits and alternatives for the proposed anesthesia with the patient or authorized representative who has indicated his/her understanding and acceptance.   Dental advisory given  Plan Discussed with: CRNA and Surgeon  Anesthesia Plan Comments:         Anesthesia Quick Evaluation

## 2016-07-31 NOTE — Discharge Instructions (Addendum)
Keep splint clean and dry Keep arm elevated Work fingers as much as possible  AMBULATORY SURGERY  DISCHARGE INSTRUCTIONS   1) The drugs that you were given will stay in your system until tomorrow so for the next 24 hours you should not:  A) Drive an automobile B) Make any legal decisions C) Drink any alcoholic beverage   2) You may resume regular meals tomorrow.  Today it is better to start with liquids and gradually work up to solid foods.  You may eat anything you prefer, but it is better to start with liquids, then soup and crackers, and gradually work up to solid foods.   3) Please notify your doctor immediately if you have any unusual bleeding, trouble breathing, redness and pain at the surgery site, drainage, fever, or pain not relieved by medication.    4) Additional Instructions:        Please contact your physician with any problems or Same Day Surgery at [phone], Monday through Friday 6 am to 4 pm, or [place] at [facility] Main number at [phone].

## 2016-07-31 NOTE — Anesthesia Procedure Notes (Signed)
Procedure Name: LMA Insertion Date/Time: 07/31/2016 2:04 PM Performed by: Junious SilkNOLES, Aron Inge Pre-anesthesia Checklist: Patient identified, Patient being monitored, Timeout performed, Emergency Drugs available and Suction available Patient Re-evaluated:Patient Re-evaluated prior to inductionOxygen Delivery Method: Circle system utilized Preoxygenation: Pre-oxygenation with 100% oxygen Intubation Type: IV induction Ventilation: Mask ventilation without difficulty LMA: LMA inserted LMA Size: 4.5 Tube type: Oral Number of attempts: 1 Placement Confirmation: positive ETCO2 and breath sounds checked- equal and bilateral Tube secured with: Tape Dental Injury: Teeth and Oropharynx as per pre-operative assessment

## 2016-08-01 ENCOUNTER — Encounter: Payer: Self-pay | Admitting: Orthopedic Surgery

## 2016-08-01 NOTE — Anesthesia Postprocedure Evaluation (Signed)
Anesthesia Post Note  Patient: Timothy Wilkins  Procedure(s) Performed: Procedure(s) (LRB): OPEN REDUCTION INTERNAL FIXATION (ORIF) WRIST FRACTURE (Left)  Patient location during evaluation: PACU Anesthesia Type: General Level of consciousness: awake and alert and oriented Pain management: pain level controlled Vital Signs Assessment: post-procedure vital signs reviewed and stable Respiratory status: spontaneous breathing Cardiovascular status: blood pressure returned to baseline Anesthetic complications: no    Last Vitals:  Vitals:   07/31/16 1601 07/31/16 1650  BP: 138/77 (!) 142/77  Pulse: 86 80  Resp: 16 16  Temp: (!) 36.1 C     Last Pain:  Vitals:   08/01/16 1035  TempSrc:   PainSc: (P) 5                  Cameryn Schum

## 2016-11-27 ENCOUNTER — Emergency Department: Payer: Worker's Compensation

## 2016-11-27 ENCOUNTER — Emergency Department
Admission: EM | Admit: 2016-11-27 | Discharge: 2016-11-27 | Disposition: A | Discharge status: Home / Self Care | Payer: Worker's Compensation | Attending: Emergency Medicine | Admitting: Emergency Medicine

## 2016-11-27 ENCOUNTER — Encounter: Payer: Self-pay | Admitting: Emergency Medicine

## 2016-11-27 DIAGNOSIS — W230XXA Caught, crushed, jammed, or pinched between moving objects, initial encounter: Secondary | ICD-10-CM | POA: Diagnosis not present

## 2016-11-27 DIAGNOSIS — Z79899 Other long term (current) drug therapy: Secondary | ICD-10-CM | POA: Insufficient documentation

## 2016-11-27 DIAGNOSIS — I10 Essential (primary) hypertension: Secondary | ICD-10-CM | POA: Insufficient documentation

## 2016-11-27 DIAGNOSIS — Y990 Civilian activity done for income or pay: Secondary | ICD-10-CM | POA: Diagnosis not present

## 2016-11-27 DIAGNOSIS — Y929 Unspecified place or not applicable: Secondary | ICD-10-CM | POA: Insufficient documentation

## 2016-11-27 DIAGNOSIS — S6991XA Unspecified injury of right wrist, hand and finger(s), initial encounter: Secondary | ICD-10-CM | POA: Insufficient documentation

## 2016-11-27 DIAGNOSIS — Y9389 Activity, other specified: Secondary | ICD-10-CM | POA: Diagnosis not present

## 2016-11-27 MED ORDER — TRAMADOL HCL 50 MG PO TABS: 50.0000 mg | ORAL_TABLET | Freq: Four times a day (QID) | ORAL | 0 refills | Status: DC | PRN

## 2016-11-27 NOTE — ED Provider Notes (Signed)
[facility] Emergency Department Provider Note ____________________________________________  Time seen: Approximately 7:46 PM  I have reviewed the triage vital signs and the nursing notes.   HISTORY  Chief Complaint Hand Injury    HPI Timothy Wilkins is a 61 y.o. male who presents to the emergency department for evaluation of right hand pain. He states that he had a piece of machinery roll over his right hand today while at work.He is right-hand dominant. Pain is mainly over the right pinky and radiates over across the top of the hand into the right thumb. He states that he has not taken any over-the-counter medications for pain prior to arrival. Incident occurred today before lunch.  Past Medical History:  Diagnosis Date  . Hypertension     There are no active problems to display for this patient.   Past Surgical History:  Procedure Laterality Date  . APPENDECTOMY    . ORIF WRIST FRACTURE Left 07/31/2016   Procedure: OPEN REDUCTION INTERNAL FIXATION (ORIF) WRIST FRACTURE;  Surgeon: Kennedy Bucker, MD;  Location: [facility] ORS;  Service: Orthopedics;  Laterality: Left;    Prior to Admission medications   Medication Sig Start Date End Date Taking? Authorizing Provider  amLODipine-valsartan (EXFORGE) 10-320 MG tablet Take 0.5 tablets by mouth every evening.     Historical Provider, MD  cephALEXin (KEFLEX) 500 MG capsule Take 1 capsule (500 mg total) by mouth 2 (two) times daily. 07/28/16   Loleta Rose, MD  HYDROcodone-acetaminophen (NORCO) 5-325 MG tablet Take 1 tablet by mouth every 6 (six) hours as needed for moderate pain. 07/31/16   Kennedy Bucker, MD  ondansetron (ZOFRAN ODT) 4 MG disintegrating tablet Allow 1-2 tablets to dissolve in your mouth every 8 hours as needed for nausea/vomiting Patient not taking: Reported on 07/31/2016 07/28/16   Loleta Rose, MD  oxyCODONE-acetaminophen (ROXICET) 5-325 MG tablet Take 1-2 tablets by mouth every 4 (four) hours as needed  for severe pain. 07/28/16   Loleta Rose, MD  traMADol (ULTRAM) 50 MG tablet Take 1 tablet (50 mg total) by mouth every 6 (six) hours as needed. 11/27/16   Chinita Pester, FNP    Allergies Patient has no known allergies.  History reviewed. No pertinent family history.  Social History Social History  Substance Use Topics  . Smoking status: Never Smoker  . Smokeless tobacco: Never Used  . Alcohol use No    Review of Systems Constitutional: No recent illness. Cardiovascular: Denies Blood loss Respiratory: Denies shortness of breath. Musculoskeletal: Pain in Right hand Skin: Positive for abrasion. Neurological: Negative for focal weakness or numbness  ____________________________________________   PHYSICAL EXAM:  VITAL SIGNS: ED Triage Vitals  Enc Vitals Group     BP 11/27/16 1909 (!) 171/88     Pulse Rate 11/27/16 1909 79     Resp 11/27/16 1909 18     Temp 11/27/16 1909 98.8 F (37.1 C)     Temp Source 11/27/16 1909 Oral     SpO2 11/27/16 1909 94 %     Weight 11/27/16 1910 185 lb (83.9 kg)     Height 11/27/16 1910 6' (1.829 m)     Head Circumference --      Peak Flow --      Pain Score 11/27/16 1909 5     Pain Loc --      Pain Edu? --      Excl. in GC? --     Constitutional: Alert and oriented. Well appearing and in no acute distress.  Respiratory: Normal respiratory effort.   Musculoskeletal: Full range of motion throughout the right hand and wrist. Tenderness elicited on palpation over the proximal fourth and fifth metacarpal. No tenderness elicited with palpation over the anatomical snuffbox. Neurologic:  Normal speech and language. No gross focal neurologic deficits are appreciated. Speech is normal. No gait instability. Skin:  Skin is warm, dry and intact. Atraumatic. Psychiatric: Mood and affect are normal. Speech and behavior are normal.  ____________________________________________   LABS (all labs ordered are listed, but only abnormal results are  displayed)  Labs Reviewed - No data to display ____________________________________________  RADIOLOGY  Medial soft tissue swelling with areas of mild osteoarthritic change, but no fracture or dislocation and no radiopaque foreign body per radiology. I, Kem Boroughs, personally viewed and evaluated these images (plain radiographs) as part of my medical decision making, as well as reviewing the written report by the radiologist. ___________________________________________   PROCEDURES  Procedure(s) performed: Velcro hand and wrist splint applied by ER tech. He is neurovascularly intact post-application.  ____________________________________________   INITIAL IMPRESSION / ASSESSMENT AND PLAN / ED COURSE  61 year old male presenting to the emergency department for evaluation of right hand injury while at work today. X-rays are negative for acute bony abnormality which is also consistent with his exam. He was placed in a hand and wrist splint for his comfort. He was instructed to follow-up with primary care provider if he feels he needs to wear it for longer than a week. He'll be given a prescription for tramadol to be taken for pain if needed. He was also advised that he may take ibuprofen or Tylenol. He was instructed to return to the emergency department for symptoms that change or worsen if he is unable to schedule an appointment.  Pertinent labs & imaging results that were available during my care of the patient were reviewed by me and considered in my medical decision making (see chart for details).  _________________________________________   FINAL CLINICAL IMPRESSION(S) / ED DIAGNOSES  Final diagnoses:  Injury of right hand, initial encounter    Discharge Medication List as of 11/27/2016  8:43 PM    START taking these medications   Details  traMADol (ULTRAM) 50 MG tablet Take 1 tablet (50 mg total) by mouth every 6 (six) hours as needed., Starting Tue 11/27/2016, Print         If controlled substance prescribed during this visit, 12 month history viewed on the [place] prior to issuing an initial prescription for Schedule II or III opiod.    Chinita Pester, FNP 11/27/16 2138    Myrna Blazer, MD 11/28/16 [phone]

## 2016-11-27 NOTE — ED Triage Notes (Signed)
Pt arrived to the ED accompanied by his wife for complaints of right had pain secondary to a crushing injury suffered at work. Pt has loss of sensation, limited range of motion and intact pulse. Pt is AOx4 in no apparent distress.

## 2016-11-27 NOTE — Discharge Instructions (Signed)
Apply ice for 20 minutes per hour for the next couple of days. You may take ibuprofen in addition to the tramadol if needed for pain. Follow-up with your primary care provider for symptoms that are not improving over the week. Return to the emergency department for symptoms that change or worsen if unable schedule an appointment.

## 2019-06-25 ENCOUNTER — Encounter: Payer: Self-pay | Admitting: Emergency Medicine

## 2019-06-25 ENCOUNTER — Emergency Department
Admission: EM | Admit: 2019-06-25 | Discharge: 2019-06-25 | Disposition: A | Discharge status: Home / Self Care | Payer: 59 | Attending: Emergency Medicine | Admitting: Emergency Medicine

## 2019-06-25 ENCOUNTER — Other Ambulatory Visit: Payer: Self-pay

## 2019-06-25 ENCOUNTER — Emergency Department: Payer: 59

## 2019-06-25 DIAGNOSIS — Z23 Encounter for immunization: Secondary | ICD-10-CM | POA: Insufficient documentation

## 2019-06-25 DIAGNOSIS — I10 Essential (primary) hypertension: Secondary | ICD-10-CM | POA: Insufficient documentation

## 2019-06-25 DIAGNOSIS — S61211A Laceration without foreign body of left index finger without damage to nail, initial encounter: Secondary | ICD-10-CM | POA: Insufficient documentation

## 2019-06-25 DIAGNOSIS — Z79899 Other long term (current) drug therapy: Secondary | ICD-10-CM | POA: Diagnosis not present

## 2019-06-25 DIAGNOSIS — Y999 Unspecified external cause status: Secondary | ICD-10-CM | POA: Insufficient documentation

## 2019-06-25 DIAGNOSIS — Y929 Unspecified place or not applicable: Secondary | ICD-10-CM | POA: Diagnosis not present

## 2019-06-25 DIAGNOSIS — Y93H9 Activity, other involving exterior property and land maintenance, building and construction: Secondary | ICD-10-CM | POA: Diagnosis not present

## 2019-06-25 DIAGNOSIS — W268XXA Contact with other sharp object(s), not elsewhere classified, initial encounter: Secondary | ICD-10-CM | POA: Insufficient documentation

## 2019-06-25 MED ORDER — TETANUS-DIPHTH-ACELL PERTUSSIS 5-2.5-18.5 LF-MCG/0.5 IM SUSP
0.5000 mL | Freq: Once | INTRAMUSCULAR | Status: AC
  Administered 2019-06-25: 0.5 mL via INTRAMUSCULAR
  Filled 2019-06-25: qty 0.5

## 2019-06-25 MED ORDER — LIDOCAINE HCL 1 % IJ SOLN
5.0000 mL | Freq: Once | INTRAMUSCULAR | Status: DC
  Filled 2019-06-25: qty 10

## 2019-06-25 MED ORDER — CEPHALEXIN 500 MG PO CAPS: 500.0000 mg | ORAL_CAPSULE | Freq: Three times a day (TID) | ORAL | 0 refills | Status: AC

## 2019-06-25 NOTE — Discharge Instructions (Signed)
Take Keflex three times daily for the next seven days.  

## 2019-06-25 NOTE — ED Triage Notes (Signed)
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

## 2019-06-25 NOTE — ED Provider Notes (Signed)
[facility] Emergency Department Provider Note  ____________________________________________  Time seen: Approximately 6:45 PM  I have reviewed the triage vital signs and the nursing notes.   HISTORY  Chief Complaint Laceration    HPI Timothy Wilkins is a 63 y.o. male presents to the emergency department with a 2 cm left index finger laceration sustained while working on a gate accidentally.  Patient reports his tetanus status is up to date.  He has been able to actively move digit since injury occurred.  No numbness or tingling in the left hand.  No other alleviating measures have been attempted.        Past Medical History:  Diagnosis Date  . Hypertension     There are no active problems to display for this patient.   Past Surgical History:  Procedure Laterality Date  . APPENDECTOMY    . ORIF WRIST FRACTURE Left 07/31/2016   Procedure: OPEN REDUCTION INTERNAL FIXATION (ORIF) WRIST FRACTURE;  Surgeon: Kennedy BuckerMichael Menz, MD;  Location: [facility] ORS;  Service: Orthopedics;  Laterality: Left;    Prior to Admission medications   Medication Sig Start Date End Date Taking? Authorizing Provider  amLODipine-valsartan (EXFORGE) 10-320 MG tablet Take 0.5 tablets by mouth every evening.     [provider]  cephALEXin (KEFLEX) 500 MG capsule Take 1 capsule (500 mg total) by mouth 3 (three) times daily for 7 days. 06/25/19 07/02/19  Orvil FeilWoods, Xiong Haidar M, PA-C    Allergies Patient has no known allergies.  No family history on file.  Social History Social History   Tobacco Use  . Smoking status: Never Smoker  . Smokeless tobacco: Never Used  Substance Use Topics  . Alcohol use: No  . Drug use: No     Review of Systems  Constitutional: No fever/chills Eyes: No visual changes. No discharge ENT: No upper respiratory complaints. Cardiovascular: no chest pain. Respiratory: no cough. No SOB. Gastrointestinal: No abdominal pain.  No nausea, no vomiting.   No diarrhea.  No constipation. Genitourinary: Negative for dysuria. No hematuria Musculoskeletal: Negative for musculoskeletal pain. Skin: Patient has left index finger laceration.  Neurological: Negative for headaches, focal weakness or numbness.  ____________________________________________   PHYSICAL EXAM:  VITAL SIGNS: ED Triage Vitals [06/25/19 1706]  Enc Vitals Group     BP (!) 184/102     Pulse Rate 76     Resp 18     Temp 98.9 F (37.2 C)     Temp Source Oral     SpO2 98 %     Weight 190 lb (86.2 kg)     Height 6' (1.829 m)     Head Circumference      Peak Flow      Pain Score 5     Pain Loc      Pain Edu?      Excl. in GC?      Constitutional: Alert and oriented. Well appearing and in no acute distress. Eyes: Conjunctivae are normal. PERRL. EOMI. Head: Atraumatic. Cardiovascular: Normal rate, regular rhythm. Normal S1 and S2.  Good peripheral circulation. Respiratory: Normal respiratory effort without tachypnea or retractions. Lungs CTAB. Good air entry to the bases with no decreased or absent breath sounds. Gastrointestinal: Bowel sounds 4 quadrants. Soft and nontender to palpation. No guarding or rigidity. No palpable masses. No distention. No CVA tenderness. Musculoskeletal: Full range of motion to all extremities. No gross deformities appreciated.  No flexor or extensor tendon deficits appreciated with testing. Neurologic:  Normal speech  and language. No gross focal neurologic deficits are appreciated.  Skin: Patient has 2 cm laceration along the volar aspect of left index finger deep to underlying adipose tissue.  No flexor or extensor tendon exposure.  No significant maceration of the skin. Psychiatric: Mood and affect are normal. Speech and behavior are normal. Patient exhibits appropriate insight and judgement.   ____________________________________________   LABS (all labs ordered are listed, but only abnormal results are displayed)  Labs Reviewed -  No data to display ____________________________________________  EKG   ____________________________________________  RADIOLOGY I personally viewed and evaluated these images as part of my medical decision making, as well as reviewing the written report by the radiologist.  Dg Finger Index Left  Result Date: 06/25/2019 CLINICAL DATA:  Distal index finger laceration. EXAM: LEFT INDEX FINGER 2+V COMPARISON:  None. FINDINGS: No acute fracture or dislocation. Joint spaces are preserved. Deep soft tissue laceration along the radial and volar aspect of the distal index finger with multiple punctate radiopaque foreign bodies. IMPRESSION: 1.  No acute osseous abnormality. 2. Deep soft tissue laceration along the distal index finger with multiple punctate radiopaque foreign bodies. Electronically Signed   By: Obie Dredge M.D.   On: 06/25/2019 18:04    ____________________________________________    PROCEDURES  Procedure(s) performed:    Procedures  LACERATION REPAIR Performed by: Ernst Bowler PA-S Authorized by: Orvil Feil Consent: Verbal consent obtained. Risks and benefits: risks, benefits and alternatives were discussed Consent given by: patient Patient identity confirmed: provided demographic data Prepped and Draped in normal sterile fashion Wound explored  Laceration Location: Left index finger  Laceration Length: 2 cm  No Foreign Bodies seen or palpated  Anesthesia: Digital block  Anesthetic total: 4 ml  Irrigation method: syringe Amount of cleaning: standard  Skin closure: 4-0 Ethilon   Number of sutures: 6  Technique: Simple Interrupted   Patient tolerance: Patient tolerated the procedure well with no immediate complications.    Medications  lidocaine (XYLOCAINE) 1 % (with pres) injection 5 mL (has no administration in time range)  Tdap (BOOSTRIX) injection 0.5 mL (has no administration in time range)      ____________________________________________   INITIAL IMPRESSION / ASSESSMENT AND PLAN / ED COURSE  Pertinent labs & imaging results that were available during my care of the patient were reviewed by me and considered in my medical decision making (see chart for details).  Review of the [place] CSRS was performed in accordance of the [facility] prior to dispensing any controlled drugs.         Assessment and plan Finger laceration 63 year old male presents to the emergency department with a 2 cm left index finger laceration repaired in the emergency department without complication.  Patient's tetanus status was updated in the emergency department and sutures were recommended for removal after 7 days.  He was discharged with Keflex.  All patient questions were answered.     ____________________________________________  FINAL CLINICAL IMPRESSION(S) / ED DIAGNOSES  Final diagnoses:  Laceration of left index finger without foreign body without damage to nail, initial encounter      NEW MEDICATIONS STARTED DURING THIS VISIT:  ED Discharge Orders         Ordered    cephALEXin (KEFLEX) 500 MG capsule  3 times daily     06/25/19 1842              This chart was dictated using voice recognition software/Dragon. Despite best efforts to proofread, errors can occur which can  change the meaning. Any change was purely unintentional.    Lannie Fields, PA-C 06/25/19 1851    Harvest Dark, MD 06/25/19 2232

## 2019-06-25 NOTE — ED Notes (Signed)
Patient instructed to take his BP medication when he gets home, as he usually takes it in the evening and his blood pressure is elevated. OK per EDP

## 2020-10-18 ENCOUNTER — Ambulatory Visit: Payer: Medicare HMO

## 2020-10-18 ENCOUNTER — Other Ambulatory Visit: Payer: Self-pay

## 2020-10-18 DIAGNOSIS — Z0283 Encounter for blood-alcohol and blood-drug test: Secondary | ICD-10-CM

## 2022-09-05 ENCOUNTER — Ambulatory Visit (INDEPENDENT_AMBULATORY_CARE_PROVIDER_SITE_OTHER): Payer: Medicare HMO | Admitting: Urology

## 2022-09-05 ENCOUNTER — Encounter: Payer: Self-pay | Admitting: Urology

## 2022-09-05 ENCOUNTER — Ambulatory Visit
Admission: RE | Admit: 2022-09-05 | Discharge: 2022-09-05 | Disposition: A | Discharge status: Home / Self Care | Payer: Medicare HMO | Source: Ambulatory Visit | Attending: Urology | Admitting: Urology

## 2022-09-05 DIAGNOSIS — N132 Hydronephrosis with renal and ureteral calculous obstruction: Secondary | ICD-10-CM

## 2022-09-05 DIAGNOSIS — N23 Unspecified renal colic: Secondary | ICD-10-CM

## 2022-09-05 DIAGNOSIS — N200 Calculus of kidney: Secondary | ICD-10-CM

## 2022-09-05 DIAGNOSIS — N201 Calculus of ureter: Secondary | ICD-10-CM | POA: Diagnosis not present

## 2022-09-05 NOTE — H&P (View-Only) (Signed)
   09/05/2022 2:56 PM   Timothy Wilkins 10/15/1955 [id number]  Referring provider: Niemeyer, Meindert, MD [facility] [place],  [place] [id number]  Chief Complaint  Patient presents with   Nephrolithiasis    HPI: Timothy Wilkins is a 67 y.o. male presents for evaluation of nephrolithiasis.  He states I have previously performed SWL however was not sure how long ago.  No previous records on Epic review [facility] ED visit 07/11/2022 with acute onset of right flank pain that started the morning of his evaluation.  Severity rated 10/10 without identifiable precipitating, aggravating or alleviating factors. Stone protocol CT was performed which showed a 2 mm right distal ureteral calculus with mild hydronephrosis/hydroureter.  In addition there was a 1 cm left UPJ calculus with left hydronephrosis which was felt to be chronic His pain has resolved and he states he did pass a small stone several days after his visit.   PMH: Past Medical History:  Diagnosis Date   Hypertension     Surgical History: Past Surgical History:  Procedure Laterality Date   APPENDECTOMY     ORIF WRIST FRACTURE Left 07/31/2016   Procedure: OPEN REDUCTION INTERNAL FIXATION (ORIF) WRIST FRACTURE;  Surgeon: Michael Menz, MD;  Location: [facility] ORS;  Service: Orthopedics;  Laterality: Left;    Home Medications:  Allergies as of 09/05/2022       Reactions   Cortisone Rash   In large doses        Medication List        Accurate as of September 05, 2022 11:59 PM. If you have any questions, ask your nurse or doctor.          amLODipine-valsartan 10-320 MG tablet Commonly known as: EXFORGE Take 0.5 tablets by mouth every evening.        Allergies:  Allergies  Allergen Reactions   Cortisone Rash    In large doses    Family History: History reviewed. No pertinent family history.  Social History:  reports that he has never smoked. He has never used smokeless tobacco. He reports that he does  not drink alcohol and does not use drugs.   Physical Exam: Constitutional:  Alert and oriented, No acute distress. HEENT: [place] AT Respiratory: Normal respiratory effort, no increased work of breathing. Psychiatric: Normal mood and affect.    Pertinent Imaging: I attempted to review his CT on [facility] however was unable to log on.   Assessment & Plan:   67 y.o. male with recent episode of severe renal colic secondary to a right distal ureteral calculus which he passed Also noted to have an obstructing 1 cm left UPJ stone which appears to be chronic Unable to review CT images on [facility] and will attempt tomorrow Options for his left-sided calculus were reviewed including ureteroscopy and SWL.  He is more interested in SWL.  Once CT is reviewed including stone density will contact patient  Addendum: CT images were personally reviewed and interpreted.  Versus UPJ calculus with moderate hydronephrosis and parenchymal thinning.  Nonobstructing calculi are also present.   Budd Freiermuth C Dontavius Keim, MD  [facility] [address], [address] [place], [place] [address] [phone] 

## 2022-09-05 NOTE — Progress Notes (Signed)
   09/05/2022 2:56 PM   Timothy Wilkins 03-Nov-1955 [id number]  Referring provider: Lorelee Market, [place],  [place] [address]  Chief Complaint  Patient presents with   Nephrolithiasis    HPI: Timothy Wilkins is a 67 y.o. male presents for evaluation of nephrolithiasis.  He states I have previously performed SWL however was not sure how long ago.  No previous records on Epic review [facility] ED visit 07/11/2022 with acute onset of right flank pain that started the morning of his evaluation.  Severity rated 10/10 without identifiable precipitating, aggravating or alleviating factors. Stone protocol CT was performed which showed a 2 mm right distal ureteral calculus with mild hydronephrosis/hydroureter.  In addition there was a 1 cm left UPJ calculus with left hydronephrosis which was felt to be chronic His pain has resolved and he states he did pass a small stone several days after his visit.   PMH: Past Medical History:  Diagnosis Date   Hypertension     Surgical History: Past Surgical History:  Procedure Laterality Date   APPENDECTOMY     ORIF WRIST FRACTURE Left 07/31/2016   Procedure: OPEN REDUCTION INTERNAL FIXATION (ORIF) WRIST FRACTURE;  Surgeon: Hessie Knows, MD;  Location: [facility] ORS;  Service: Orthopedics;  Laterality: Left;    Home Medications:  Allergies as of 09/05/2022       Reactions   Cortisone Rash   In large doses        Medication List        Accurate as of September 05, 2022 11:59 PM. If you have any questions, ask your nurse or doctor.          amLODipine-valsartan 10-320 MG tablet Commonly known as: EXFORGE Take 0.5 tablets by mouth every evening.        Allergies:  Allergies  Allergen Reactions   Cortisone Rash    In large doses    Family History: History reviewed. No pertinent family history.  Social History:  reports that he has never smoked. He has never used smokeless tobacco. He reports that he does  not drink alcohol and does not use drugs.   Physical Exam: Constitutional:  Alert and oriented, No acute distress. HEENT: [place] AT Respiratory: Normal respiratory effort, no increased work of breathing. Psychiatric: Normal mood and affect.    Pertinent Imaging: I attempted to review his CT on [facility] however was unable to log on.   Assessment & Plan:   67 y.o. male with recent episode of severe renal colic secondary to a right distal ureteral calculus which he passed Also noted to have an obstructing 1 cm left UPJ stone which appears to be chronic Unable to review CT images on [facility] and will attempt tomorrow Options for his left-sided calculus were reviewed including ureteroscopy and SWL.  He is more interested in SWL.  Once CT is reviewed including stone density will contact patient  Addendum: CT images were personally reviewed and interpreted.  Versus UPJ calculus with moderate hydronephrosis and parenchymal thinning.  Nonobstructing calculi are also present.   Abbie Sons, [place] [address], [place], [place] [address] [phone]

## 2022-09-09 ENCOUNTER — Encounter: Payer: Self-pay | Admitting: Urology

## 2022-09-10 ENCOUNTER — Telehealth: Payer: Self-pay | Admitting: *Deleted

## 2022-09-10 NOTE — Telephone Encounter (Signed)
-----  Message from Abbie Sons, MD sent at 09/09/2022 12:04 PM EST ----- CT at [facility] and [facility] were reviewed.  The obstructing left renal stone appears to be chronic and may not fragment well with shockwave lithotripsy.  Recommend scheduling left ureteroscopy.  Can schedule follow-up office visit to discuss in more detail

## 2022-09-10 NOTE — Telephone Encounter (Signed)
Notified patient as instructed, patient would like to have ureteroscopy done

## 2022-09-11 ENCOUNTER — Other Ambulatory Visit: Payer: Self-pay | Admitting: Urology

## 2022-09-11 DIAGNOSIS — N200 Calculus of kidney: Secondary | ICD-10-CM

## 2022-09-11 NOTE — Progress Notes (Signed)
Surgical Physician Order Form [facility] [place]  * Scheduling expectation :  pt preference  *Length of Case: 2 hrs  *Clearance needed: no  *Anticoagulation Instructions: N/A  *Aspirin Instructions: N/A  *Post-op visit Date/Instructions:   TBD  *Diagnosis: Left Nephrolithiasis  *Procedure:  Left  Ureteroscopy w/laser lithotripsy & stent placement ([id number])   Additional orders: N/A  -Admit type: OUTpatient  -Anesthesia: General  -VTE Prophylaxis Standing Order SCD's       Other:   -Standing Lab Orders Per Anesthesia    Lab other: UA&Urine Culture  -Standing Test orders EKG/Chest x-ray per Anesthesia       Test other:   - Medications:   IV antibiotic based on urine cx  -Other orders:  N/A

## 2022-09-13 ENCOUNTER — Telehealth: Payer: Self-pay

## 2022-09-13 NOTE — Telephone Encounter (Signed)
I spoke with Timothy Wilkins. We have discussed possible surgery dates and Tuesday February 6th, 2024 was agreed upon by all parties. Patient given information about surgery date, what to expect pre-operatively and post operatively.  We discussed that a Pre-Admission Testing office will be calling to set up the pre-op visit that will take place prior to surgery, and that these appointments are typically done over the phone with a Pre-Admissions RN. Informed patient that our office will communicate any additional care to be provided after surgery. Patients questions or concerns were discussed during our call. Advised to call our office should there be any additional information, questions or concerns that arise. Patient verbalized understanding.

## 2022-09-13 NOTE — Progress Notes (Signed)
   [facility] From  Surgery Date: Date: 10/02/2022  Surgeon: Dr. John Giovanni, MD  Inpt ( No  )   Outpt (Yes)   Obs ( No  )   Diagnosis: N20.0 Left Nephrolithiasis  -CPT: [phone]  Surgery: Left Ureteroscopy with laser lithotripsy and stent placement   Stop Anticoagulations: N/A  Cardiac/Medical/Pulmonary Clearance needed: no  *Orders entered into EPIC  Date: 09/13/22   *Case booked in [place]  Date: 09/12/2022  *Notified pt of Surgery: Date: 09/12/2022  PRE-OP UA & CX: yes, will obtain in clinic on 09/19/2022  *Placed into Prior Authorization Work [place]io [place] Date: 09/13/22  Assistant/laser/rep:No

## 2022-09-19 ENCOUNTER — Other Ambulatory Visit: Payer: Medicare HMO

## 2022-09-19 ENCOUNTER — Other Ambulatory Visit: Payer: Self-pay | Admitting: Family Medicine

## 2022-09-19 DIAGNOSIS — N132 Hydronephrosis with renal and ureteral calculous obstruction: Secondary | ICD-10-CM

## 2022-09-19 DIAGNOSIS — N200 Calculus of kidney: Secondary | ICD-10-CM

## 2022-09-19 LAB — URINALYSIS, COMPLETE
Bilirubin, UA: NEGATIVE
Glucose, UA: NEGATIVE
Ketones, UA: NEGATIVE
Leukocytes,UA: NEGATIVE
Nitrite, UA: NEGATIVE
RBC, UA: NEGATIVE
Specific Gravity, UA: >1.03 — ABNORMAL HIGH (ref 1.005–1.030)
Urobilinogen, Ur: 0.2 mg/dL (ref 0.2–1.0)
pH, UA: 5 (ref 5.0–7.5)

## 2022-09-19 LAB — MICROSCOPIC EXAMINATION

## 2022-09-21 ENCOUNTER — Encounter
Admission: RE | Admit: 2022-09-21 | Discharge: 2022-09-21 | Disposition: A | Discharge status: Home / Self Care | Payer: Medicare HMO | Source: Ambulatory Visit | Attending: Urology | Admitting: Urology

## 2022-09-21 VITALS — Ht 72.0 in | Wt 185.0 lb

## 2022-09-21 DIAGNOSIS — Z01812 Encounter for preprocedural laboratory examination: Secondary | ICD-10-CM

## 2022-09-21 DIAGNOSIS — I10 Essential (primary) hypertension: Secondary | ICD-10-CM

## 2022-09-21 HISTORY — DX: Personal history of urinary calculi: Z87.442

## 2022-09-21 NOTE — Patient Instructions (Addendum)
Your procedure is scheduled on: Tuesday October 02, 2022. Report to Day Surgery inside [facility] 2nd floor, stop by registration desk before getting on elevator.  To find out your arrival time please call [phone] between 1PM - 3PM on Monday October 01, 2022.  Remember: Instructions that are not followed completely may result in serious medical risk,  up to and including death, or upon the discretion of your surgeon and anesthesiologist your  surgery may need to be rescheduled.     _X__ 1. Do not eat food or drink fluids after midnight the night before your procedure.                 No chewing gum or hard candies.   __X__2.  On the morning of surgery brush your teeth with toothpaste and water, you                may rinse your mouth with mouthwash if you wish.  Do not swallow any toothpaste or mouthwash.     _X__ 3.  No Alcohol for 24 hours before or after surgery.   _X__ 4.  Do Not Smoke or use e-cigarettes For 24 Hours Prior to Your Surgery.                 Do not use any chewable tobacco products for at least 6 hours prior to                 Surgery.  _X__  5.  Do not use any recreational drugs (marijuana, cocaine, heroin, ecstasy, MDMA or other)                For at least one week prior to your surgery.  Combination of these drugs with anesthesia                May have life threatening results.  ____  6.  Bring all medications with you on the day of surgery if instructed.   __X_  7.  Notify your doctor if there is any change in your medical condition      (cold, fever, infections).     Do not wear jewelry, make-up, hairpins, clips or nail polish. Do not wear lotions, powders, or perfumes. You may wear deodorant. Do not shave 48 hours prior to surgery. Men may shave face and neck. Do not bring valuables to the hospital.    [facility] is not responsible for any belongings or valuables.  Contacts, dentures or bridgework may not be worn into  surgery. Leave your suitcase in the car. After surgery it may be brought to your room. For patients admitted to the hospital, discharge time is determined by your treatment team.   Patients discharged the day of surgery will not be allowed to drive home.   Make arrangements for someone to be with you for the first 24 hours of your Same Day Discharge.   __X__ Take these medicines the morning of surgery with A SIP OF WATER:    1. doxazosin (CARDURA) 4 MG   2.   3.   4.  5.  6.  ____ Fleet Enema (as directed)   ____ Use CHG Soap (or wipes) as directed  ____ Use Benzoyl Peroxide Gel as instructed  ____ Use inhalers on the day of surgery  ____ Stop metformin 2 days prior to surgery    ____ Take 1/2 of usual insulin dose the night before surgery. No insulin the morning  of surgery.   ____ Call your PCP, cardiologist, or Pulmonologist if taking Coumadin/Plavix/aspirin and ask when to stop before your surgery.   __X__ One Week prior to surgery- Stop Anti-inflammatories such as Ibuprofen, Aleve, Advil, Motrin, meloxicam (MOBIC), diclofenac, etodolac, ketorolac, Toradol, Daypro, piroxicam, Goody's or BC powders. OK TO USE TYLENOL IF NEEDED   __X__ Stop supplements until after surgery.    ____ Bring C-Pap to the hospital.    If you have any questions regarding your pre-procedure instructions,  Please call Pre-admit Testing at [phone]

## 2022-09-24 ENCOUNTER — Encounter
Admission: RE | Admit: 2022-09-24 | Discharge: 2022-09-24 | Disposition: A | Discharge status: Home / Self Care | Payer: Medicare HMO | Source: Ambulatory Visit | Attending: Urology | Admitting: Urology

## 2022-09-24 DIAGNOSIS — Z01812 Encounter for preprocedural laboratory examination: Secondary | ICD-10-CM

## 2022-09-24 DIAGNOSIS — Z01810 Encounter for preprocedural cardiovascular examination: Secondary | ICD-10-CM | POA: Diagnosis present

## 2022-09-24 DIAGNOSIS — I10 Essential (primary) hypertension: Secondary | ICD-10-CM

## 2022-09-24 DIAGNOSIS — R9431 Abnormal electrocardiogram [ECG] [EKG]: Secondary | ICD-10-CM | POA: Insufficient documentation

## 2022-09-24 LAB — CULTURE, URINE COMPREHENSIVE

## 2022-10-02 ENCOUNTER — Encounter
Admission: RE | Disposition: A | Discharge status: Home / Self Care | Payer: Self-pay | Source: Ambulatory Visit | Attending: Urology

## 2022-10-02 ENCOUNTER — Encounter: Payer: Self-pay | Admitting: Urology

## 2022-10-02 ENCOUNTER — Ambulatory Visit: Payer: Medicare HMO

## 2022-10-02 ENCOUNTER — Ambulatory Visit: Payer: Medicare HMO | Admitting: Urgent Care

## 2022-10-02 ENCOUNTER — Other Ambulatory Visit: Payer: Self-pay

## 2022-10-02 ENCOUNTER — Ambulatory Visit
Admission: RE | Admit: 2022-10-02 | Discharge: 2022-10-02 | Disposition: A | Discharge status: Home / Self Care | Payer: Medicare HMO | Source: Ambulatory Visit | Attending: Urology | Admitting: Urology

## 2022-10-02 ENCOUNTER — Ambulatory Visit: Payer: Medicare HMO | Admitting: Anesthesiology

## 2022-10-02 ENCOUNTER — Other Ambulatory Visit: Payer: Self-pay | Admitting: Urology

## 2022-10-02 DIAGNOSIS — I10 Essential (primary) hypertension: Secondary | ICD-10-CM | POA: Diagnosis not present

## 2022-10-02 DIAGNOSIS — N400 Enlarged prostate without lower urinary tract symptoms: Secondary | ICD-10-CM | POA: Diagnosis not present

## 2022-10-02 DIAGNOSIS — N132 Hydronephrosis with renal and ureteral calculous obstruction: Secondary | ICD-10-CM | POA: Insufficient documentation

## 2022-10-02 DIAGNOSIS — N201 Calculus of ureter: Secondary | ICD-10-CM | POA: Diagnosis not present

## 2022-10-02 DIAGNOSIS — N3289 Other specified disorders of bladder: Secondary | ICD-10-CM | POA: Diagnosis not present

## 2022-10-02 DIAGNOSIS — Z79899 Other long term (current) drug therapy: Secondary | ICD-10-CM | POA: Insufficient documentation

## 2022-10-02 DIAGNOSIS — N200 Calculus of kidney: Secondary | ICD-10-CM

## 2022-10-02 DIAGNOSIS — N1330 Unspecified hydronephrosis: Secondary | ICD-10-CM

## 2022-10-02 HISTORY — PX: CYSTOSCOPY/URETEROSCOPY/HOLMIUM LASER/STENT PLACEMENT: SHX6546

## 2022-10-02 SURGERY — CYSTOSCOPY/URETEROSCOPY/HOLMIUM LASER/STENT PLACEMENT
Anesthesia: General | Site: Ureter | Laterality: Left

## 2022-10-02 MED ORDER — IOHEXOL 180 MG/ML  SOLN
INTRAMUSCULAR | Status: DC | PRN
  Administered 2022-10-02 (×3): 10 mL
  Administered 2022-10-02: 20 mL

## 2022-10-02 MED ORDER — ORAL CARE MOUTH RINSE: 15.0000 mL | Freq: Once | OROMUCOSAL | Status: AC

## 2022-10-02 MED ORDER — OXYBUTYNIN CHLORIDE 5 MG PO TABS: ORAL_TABLET | ORAL | 0 refills | Status: DC

## 2022-10-02 MED ORDER — MIDAZOLAM HCL 2 MG/2ML IJ SOLN
INTRAMUSCULAR | Status: DC | PRN
  Administered 2022-10-02: 2 mg via INTRAVENOUS

## 2022-10-02 MED ORDER — KETAMINE HCL 50 MG/5ML IJ SOSY
PREFILLED_SYRINGE | INTRAMUSCULAR | Status: AC
  Filled 2022-10-02: qty 5

## 2022-10-02 MED ORDER — FAMOTIDINE 20 MG PO TABS: 20.0000 mg | ORAL_TABLET | Freq: Once | ORAL | Status: AC

## 2022-10-02 MED ORDER — ONDANSETRON HCL 4 MG/2ML IJ SOLN: 4.0000 mg | Freq: Once | INTRAMUSCULAR | Status: DC | PRN

## 2022-10-02 MED ORDER — HYDRALAZINE HCL 20 MG/ML IJ SOLN
INTRAMUSCULAR | Status: DC | PRN
  Administered 2022-10-02 (×2): 2 mg via INTRAVENOUS

## 2022-10-02 MED ORDER — LIDOCAINE HCL URETHRAL/MUCOSAL 2 % EX GEL
CUTANEOUS | Status: DC | PRN
  Administered 2022-10-02: 1 via URETHRAL

## 2022-10-02 MED ORDER — FAMOTIDINE 20 MG PO TABS
ORAL_TABLET | ORAL | Status: AC
  Administered 2022-10-02: 20 mg via ORAL
  Filled 2022-10-02: qty 1

## 2022-10-02 MED ORDER — PROPOFOL 10 MG/ML IV BOLUS
INTRAVENOUS | Status: DC | PRN
  Administered 2022-10-02: 140 mg via INTRAVENOUS

## 2022-10-02 MED ORDER — CHLORHEXIDINE GLUCONATE 0.12 % MT SOLN: 15.0000 mL | Freq: Once | OROMUCOSAL | Status: AC

## 2022-10-02 MED ORDER — SODIUM CHLORIDE 0.9 % IR SOLN
Status: DC | PRN
  Administered 2022-10-02 (×2): 3000 mL via INTRAVESICAL

## 2022-10-02 MED ORDER — CEFAZOLIN SODIUM-DEXTROSE 2-4 GM/100ML-% IV SOLN
INTRAVENOUS | Status: AC
  Filled 2022-10-02: qty 100

## 2022-10-02 MED ORDER — FENTANYL CITRATE (PF) 100 MCG/2ML IJ SOLN: 25.0000 ug | INTRAMUSCULAR | Status: DC | PRN

## 2022-10-02 MED ORDER — HYDROCODONE-ACETAMINOPHEN 5-325 MG PO TABS: 1.0000 | ORAL_TABLET | Freq: Four times a day (QID) | ORAL | 0 refills | Status: DC | PRN

## 2022-10-02 MED ORDER — EPHEDRINE SULFATE (PRESSORS) 50 MG/ML IJ SOLN
INTRAMUSCULAR | Status: DC | PRN
  Administered 2022-10-02 (×2): 5 mg via INTRAVENOUS

## 2022-10-02 MED ORDER — OXYBUTYNIN CHLORIDE 5 MG PO TABS
ORAL_TABLET | ORAL | Status: AC
  Administered 2022-10-02: 5 mg via ORAL
  Filled 2022-10-02: qty 1

## 2022-10-02 MED ORDER — OXYBUTYNIN CHLORIDE 5 MG PO TABS: 5.0000 mg | ORAL_TABLET | Freq: Once | ORAL | Status: AC

## 2022-10-02 MED ORDER — CEFAZOLIN SODIUM-DEXTROSE 2-4 GM/100ML-% IV SOLN
2.0000 g | INTRAVENOUS | Status: AC
  Administered 2022-10-02: 2 g via INTRAVENOUS

## 2022-10-02 MED ORDER — FENTANYL CITRATE (PF) 100 MCG/2ML IJ SOLN
INTRAMUSCULAR | Status: AC
  Filled 2022-10-02: qty 2

## 2022-10-02 MED ORDER — MIDAZOLAM HCL 2 MG/2ML IJ SOLN
INTRAMUSCULAR | Status: AC
  Filled 2022-10-02: qty 2

## 2022-10-02 MED ORDER — ROCURONIUM BROMIDE 100 MG/10ML IV SOLN
INTRAVENOUS | Status: DC | PRN
  Administered 2022-10-02: 60 mg via INTRAVENOUS

## 2022-10-02 MED ORDER — LIDOCAINE HCL URETHRAL/MUCOSAL 2 % EX GEL
CUTANEOUS | Status: AC
  Filled 2022-10-02: qty 10

## 2022-10-02 MED ORDER — LACTATED RINGERS IV SOLN: INTRAVENOUS | Status: DC

## 2022-10-02 MED ORDER — PHENYLEPHRINE 80 MCG/ML (10ML) SYRINGE FOR IV PUSH (FOR BLOOD PRESSURE SUPPORT)
PREFILLED_SYRINGE | INTRAVENOUS | Status: DC | PRN
  Administered 2022-10-02 (×5): 80 ug via INTRAVENOUS

## 2022-10-02 MED ORDER — LABETALOL HCL 5 MG/ML IV SOLN
10.0000 mg | Freq: Once | INTRAVENOUS | Status: AC
  Filled 2022-10-02: qty 4

## 2022-10-02 MED ORDER — LIDOCAINE HCL (CARDIAC) PF 100 MG/5ML IV SOSY
PREFILLED_SYRINGE | INTRAVENOUS | Status: DC | PRN
  Administered 2022-10-02: 50 mg via INTRAVENOUS

## 2022-10-02 MED ORDER — KETAMINE HCL 10 MG/ML IJ SOLN
INTRAMUSCULAR | Status: DC | PRN
  Administered 2022-10-02: 10 mg via INTRAVENOUS

## 2022-10-02 MED ORDER — CHLORHEXIDINE GLUCONATE 0.12 % MT SOLN
OROMUCOSAL | Status: AC
  Administered 2022-10-02: 15 mL via OROMUCOSAL
  Filled 2022-10-02: qty 15

## 2022-10-02 MED ORDER — FENTANYL CITRATE (PF) 100 MCG/2ML IJ SOLN
INTRAMUSCULAR | Status: DC | PRN
  Administered 2022-10-02: 100 ug via INTRAVENOUS

## 2022-10-02 MED ORDER — LABETALOL HCL 5 MG/ML IV SOLN
INTRAVENOUS | Status: AC
  Administered 2022-10-02: 10 mg via INTRAVENOUS
  Filled 2022-10-02: qty 4

## 2022-10-02 MED ORDER — SUGAMMADEX SODIUM 200 MG/2ML IV SOLN
INTRAVENOUS | Status: DC | PRN
  Administered 2022-10-02: 200 mg via INTRAVENOUS

## 2022-10-02 MED ORDER — ONDANSETRON HCL 4 MG/2ML IJ SOLN
INTRAMUSCULAR | Status: DC | PRN
  Administered 2022-10-02: 4 mg via INTRAVENOUS

## 2022-10-02 SURGICAL SUPPLY — 31 items
BAG DRAIN SIEMENS DORNER NS (MISCELLANEOUS) ×1 IMPLANT
BAG DRN NS LF (MISCELLANEOUS) ×1
BASKET ZERO TIP 1.9FR (BASKET) IMPLANT
BRUSH SCRUB EZ 1% IODOPHOR (MISCELLANEOUS) ×1 IMPLANT
BSKT STON RTRVL ZERO TP 1.9FR (BASKET)
CATH URET FLEX-TIP 2 LUMEN 10F (CATHETERS) IMPLANT
CATH URETL OPEN 5X70 (CATHETERS) IMPLANT
CATH URETL OPEN END 6X70 (CATHETERS) IMPLANT
CNTNR URN SCR LID CUP LEK RST (MISCELLANEOUS) IMPLANT
CONT SPEC 4OZ STRL OR WHT (MISCELLANEOUS)
DRAPE UTILITY 15X26 TOWEL STRL (DRAPES) ×1 IMPLANT
FIBER LASER MOSES 200 DFL (Laser) IMPLANT
GLOVE SURG UNDER POLY LF SZ7.5 (GLOVE) ×1 IMPLANT
GOWN STRL REUS W/ TWL LRG LVL3 (GOWN DISPOSABLE) ×1 IMPLANT
GOWN STRL REUS W/ TWL XL LVL3 (GOWN DISPOSABLE) ×1 IMPLANT
GOWN STRL REUS W/TWL LRG LVL3 (GOWN DISPOSABLE) ×1
GOWN STRL REUS W/TWL XL LVL3 (GOWN DISPOSABLE) ×1
GUIDEWIRE ANG ZIPWIRE 035X150 (WIRE) IMPLANT
GUIDEWIRE STR DUAL SENSOR (WIRE) ×1 IMPLANT
GUIDEWIRE STR ZIPWIRE 035X150 (MISCELLANEOUS) IMPLANT
IV NS IRRIG 3000ML ARTHROMATIC (IV SOLUTION) ×1 IMPLANT
KIT TURNOVER CYSTO (KITS) ×1 IMPLANT
PACK CYSTO AR (MISCELLANEOUS) ×1 IMPLANT
SET CYSTO W/LG BORE CLAMP LF (SET/KITS/TRAYS/PACK) ×1 IMPLANT
SHEATH NAVIGATOR HD 12/14X36 (SHEATH) IMPLANT
STENT URET 6FRX24 CONTOUR (STENTS) IMPLANT
STENT URET 6FRX26 CONTOUR (STENTS) IMPLANT
SURGILUBE 2OZ TUBE FLIPTOP (MISCELLANEOUS) ×1 IMPLANT
SYR 30ML LL (SYRINGE) IMPLANT
VALVE UROSEAL ADJ ENDO (VALVE) IMPLANT
WATER STERILE IRR 500ML POUR (IV SOLUTION) ×1 IMPLANT

## 2022-10-02 NOTE — Discharge Instructions (Addendum)
DISCHARGE INSTRUCTIONS FOR KIDNEY STONE/URETERAL STENT   MEDICATIONS:  1. Resume all your other meds from home.  2.  AZO (over-the-counter) can help with the burning/stinging when you urinate. 3.  Hydrocodone is for moderate/severe pain, Rx was sent to your pharmacy. 4.  Oxybutynin is for stent irritation.  Rx was sent to your pharmacy  ACTIVITY:  1. May resume regular activities in 24 hours. 2. No driving while on narcotic pain medications  3. Drink plenty of water  4. Continue to walk at home - you can still get blood clots when you are at home, so keep active, but [place]'t over do it.  5. May return to work/school tomorrow or when you feel ready    SIGNS/SYMPTOMS TO CALL:  Common postoperative symptoms include urinary frequency, urgency, bladder spasm and blood in the urine  Please call us if you have a fever greater than 101.5, uncontrolled nausea/vomiting, uncontrolled pain, dizziness, unable to urinate, excessively bloody urine, chest pain, shortness of breath, leg swelling, leg pain, or any other concerns or questions.   You can reach [place] at [phone].   FOLLOW-UP:  1. You will be contacted for a follow-up appointment  AMBULATORY SURGERY  DISCHARGE INSTRUCTIONS   The drugs that you were given will stay in your system until tomorrow so for the next 24 hours you should not:  Drive an automobile Make any legal decisions Drink any alcoholic beverage   You may resume regular meals tomorrow.  Today it is better to start with liquids and gradually work up to solid foods.  You may eat anything you prefer, but it is better to start with liquids, then soup and crackers, and gradually work up to solid foods.   Please notify your doctor immediately if you have any unusual bleeding, trouble breathing, redness and pain at the surgery site, drainage, fever, or pain not relieved by medication.    Additional Instructions:   Please contact your physician with any problems or Same  Day Surgery at [phone], Monday through Friday 6 am to 4 pm, or [facility] at [facility] number at [phone].

## 2022-10-02 NOTE — Interval H&P Note (Signed)
History and Physical Interval Note:  The procedure was cussed in detail including potential risks of bleeding, infection, ureteral injury.  We discussed on a small percentage of cases the upper ureter cannot be accessed with the ureteroscope due to anatomy and if this were to occur a stent would be placed and he would need a follow-up ureteroscopy in 2-3 weeks to allow for passive ureteral dilation.  All questions were answered and he desires to proceed  CV: RRR Lungs: Clear  10/02/2022 10:40 AM  Timothy Wilkins  has presented today for surgery, with the diagnosis of Left Nephrolithasis.  The various methods of treatment have been discussed with the patient and family. After consideration of risks, benefits and other options for treatment, the patient has consented to  Procedure(s): CYSTOSCOPY/URETEROSCOPY/HOLMIUM LASER/STENT PLACEMENT (Left) as a surgical intervention.  The patient's history has been reviewed, patient examined, no change in status, stable for surgery.  I have reviewed the patient's chart and labs.  Questions were answered to the patient's satisfaction.     [place]

## 2022-10-02 NOTE — Anesthesia Procedure Notes (Addendum)
Procedure Name: Intubation Date/Time: 10/02/2022 11:07 AM  Performed by: Lorie Apley, CRNAPre-anesthesia Checklist: Patient identified, Patient being monitored, Timeout performed, Emergency Drugs available and Suction available Patient Re-evaluated:Patient Re-evaluated prior to induction Oxygen Delivery Method: Circle system utilized Preoxygenation: Pre-oxygenation with 100% oxygen Induction Type: IV induction Ventilation: Mask ventilation without difficulty Laryngoscope Size: Mac, 3 and McGraph Grade View: Grade I Tube type: Oral Tube size: 7.5 mm Number of attempts: 1 Airway Equipment and Method: Stylet Placement Confirmation: ETT inserted through vocal cords under direct vision, positive ETCO2 and breath sounds checked- equal and bilateral Secured at: 23 cm Tube secured with: Tape Dental Injury: Teeth and Oropharynx as per pre-operative assessment

## 2022-10-02 NOTE — Transfer of Care (Signed)
Immediate Anesthesia Transfer of Care Note  Patient: Timothy Wilkins  Procedure(s) Performed: CYSTOSCOPY/URETEROSCOPY/HOLMIUM LASER/STENT PLACEMENT (Left: Ureter)  Patient Location: PACU  Anesthesia Type:General  Level of Consciousness: drowsy  Airway & Oxygen Therapy: Patient Spontanous Breathing and Patient connected to face mask oxygen  Post-op Assessment: Report given to RN, Post -op Vital signs reviewed and stable, and Patient moving all extremities  Post vital signs: Reviewed and stable  Last Vitals:  Vitals Value Taken Time  BP 155/88 10/02/22 1245  Temp    Pulse 75 10/02/22 1247  Resp 17 10/02/22 1247  SpO2 99 % 10/02/22 1247  Vitals shown include unvalidated device data.  Last Pain:  Vitals:   10/02/22 1010  TempSrc: Temporal  PainSc: 0-No pain         Complications: No notable events documented.

## 2022-10-02 NOTE — Anesthesia Preprocedure Evaluation (Signed)
Anesthesia Evaluation  Patient identified by MRN, date of birth, ID band Patient awake    Reviewed: Allergy & Precautions, H&P , NPO status , Patient's Chart, lab work & pertinent test results, reviewed documented beta blocker date and time   Airway Mallampati: II  TM Distance: >3 FB Neck ROM: full    Dental  (+) Teeth Intact   Pulmonary neg pulmonary ROS   Pulmonary exam normal        Cardiovascular Exercise Tolerance: Good hypertension, On Medications negative cardio ROS Normal cardiovascular exam Rhythm:regular Rate:Normal     Neuro/Psych negative neurological ROS  negative psych ROS   GI/Hepatic negative GI ROS, Neg liver ROS,,,  Endo/Other  negative endocrine ROS    Renal/GU negative Renal ROS  negative genitourinary   Musculoskeletal negative musculoskeletal ROS (+)    Abdominal   Peds negative pediatric ROS (+)  Hematology negative hematology ROS (+)   Anesthesia Other Findings Past Medical History: No date: History of kidney stones No date: Hypertension Past Surgical History: No date: APPENDECTOMY 07/31/2016: ORIF WRIST FRACTURE; Left     Comment:  Procedure: OPEN REDUCTION INTERNAL FIXATION (ORIF) WRIST              FRACTURE;  Surgeon: Hessie Knows, MD;  Location: [facility]               ORS;  Service: Orthopedics;  Laterality: Left;   Reproductive/Obstetrics negative OB ROS                             Anesthesia Physical Anesthesia Plan  ASA: 2  Anesthesia Plan: General ETT   Post-op Pain Management:    Induction:   PONV Risk Score and Plan: 3  Airway Management Planned:   Additional Equipment:   Intra-op Plan:   Post-operative Plan:   Informed Consent: I have reviewed the patients History and Physical, chart, labs and discussed the procedure including the risks, benefits and alternatives for the proposed anesthesia with the patient or authorized representative  who has indicated his/her understanding and acceptance.     Dental Advisory Given  Plan Discussed with: CRNA  Anesthesia Plan Comments:        Anesthesia Quick Evaluation

## 2022-10-03 ENCOUNTER — Encounter: Payer: Self-pay | Admitting: Urology

## 2022-10-03 NOTE — Op Note (Signed)
Preoperative diagnosis: Left UPJ calculus  Postoperative diagnosis: Left UPJ calculus  Procedure:  Cystoscopy Left ureteroscopy  Ureteroscopic laser lithotripsy Left ureteral stent placement (27F/26 cm)  Left retrograde pyelography with interpretation  Surgeon: Nicki Reaper C. Gyneth Hubka, M.D.  Anesthesia: General  Complications: None  Intraoperative findings: Cystoscopy-urethra normal in caliber without stricture.  Tightness distal prostatic urethra without definite stricture; elevated bladder neck elevation.  Mild-moderate lateral lobe enlargement prostate.  Bladder mucosa without erythema, solid or papillary lesions.  Mild bladder trabeculation.  UOs normal-appearing; no efflux seen from left UO Left ureteroscopy-impacted calculus at region of UPJ with a impaction was posterior to the calculus Retrograde pyelogram-initial retrograde pyelogram with minimal contrast seen proximal to the calculus.  Repeat retrograde pyelogram after partial stone fragmentation showed severe left hydronephrosis  EBL: Minimal  Specimens: None   Indication: Timothy Wilkins is a 67 y.o.  male seen at [facility] 07/11/2022 with right renal colic.  Stone protocol CT showed a 2 mm right distal ureteral calculus which she subsequently passed and had resolution of his symptoms.  There was also a 1 cm left UPJ calculus with severe hydronephrosis and parenchymal thinning felt to be chronic.  He has no left-sided symptoms.  After reviewing the management options for treatment, the patient elected to proceed with the above surgical procedure(s). We have discussed the potential benefits and risks of the procedure, side effects of the proposed treatment, the likelihood of the patient achieving the goals of the procedure, and any potential problems that might occur during the procedure or recuperation. Informed consent has been obtained.  Description of procedure:  The patient was taken to the operating room and  general anesthesia was induced.  The patient was placed in the dorsal lithotomy position, prepped and draped in the usual sterile fashion, and preoperative antibiotics were administered. A preoperative time-out was performed.   A 21 French cystoscope was lubricated passed per urethra and advanced proximally under direct vision with findings as described above.  Attention was directed to the left ureteral orifice and a 0.038 Sensor wire was then advanced up the level of the calculus under fluoroscopic guidance however the wire could not be advanced proximal to the calculus.  A 5 French open-ended ureteral catheter was then placed over the guidewire just proximal to the calculus and retrograde pyelogram was performed with findings as described above.  Attempts at passage of both straight and angled Zip wires were unsuccessful with the ureteral catheter position just proximal to the calculus.  A slurry of lidocaine gel and contrast was instilled through the ureteral catheter and attempts at guidewire passage were again unsuccessful.  The ureteral catheter was removed and A 4.5 Fr semirigid ureteroscope was then advanced into the ureter and was easily advanced proximally to the level of the stone.  Attempts at guidewire placement passed to the calculus under direct vision were also unsuccessful  A 200 m Moses holmium laser fiber was then placed through the ureteroscope and the stone was partially dusted at a setting of 0.3J/80 Hz and increased to 0.3J/120 Hz.  The calculus was dusted to the maximum extent with the semirigid scope.  The laser fiber was removed and the Sensor wire was able to be advanced proximal to the stone into the renal pelvis.  The semirigid ureteroscope was removed and a single channel digital flexible ureteroscope was passed per urethra.  The scope was able to be inserted in the ureter alongside the guidewire and was advanced to the level  of stone however vision was suboptimal and it was  elected to place a ureteral stent.  The flexible cystoscope was removed.  A 5 French open-ended ureteral catheter was placed over the guidewire and into the region of the renal pelvis.  The guidewire was removed and clear urine was aspirated.  Retrograde pyelogram was then performed with findings as described above.  The guidewire was replaced and the ureteral catheter was removed.  The guidewire was backloaded on the cystoscope and a 18F/26 cm contour ureteral stent was placed without difficulty.  A good curl was seen in the proximal portion of the renal pelvis under fluoroscopy and in the bladder under direct vision.  The bladder was emptied and the cystoscope was removed.  After anesthetic reversal he was transported to the PACU in stable condition.   Plan: Follow-up ureteroscopy after indwelling stent 2-3 weeks Schedule renal scan to determine differential function   John Giovanni, MD

## 2022-10-05 ENCOUNTER — Encounter: Payer: Self-pay | Admitting: Urology

## 2022-10-08 NOTE — Anesthesia Postprocedure Evaluation (Signed)
Anesthesia Post Note  Patient: Timothy Wilkins  Procedure(s) Performed: CYSTOSCOPY/URETEROSCOPY/HOLMIUM LASER/STENT PLACEMENT (Left: Ureter)  Patient location during evaluation: PACU Anesthesia Type: General Level of consciousness: awake and alert Pain management: pain level controlled Vital Signs Assessment: post-procedure vital signs reviewed and stable Respiratory status: spontaneous breathing, nonlabored ventilation, respiratory function stable and patient connected to nasal cannula oxygen Cardiovascular status: blood pressure returned to baseline and stable Postop Assessment: no apparent nausea or vomiting Anesthetic complications: no   No notable events documented.   Last Vitals:  Vitals:   10/02/22 1300 10/02/22 1338  BP: (!) 175/97 (!) 174/93  Pulse: 78 72  Resp: 14 16  Temp:  (!) 36.1 C  SpO2: 98% 99%    Last Pain:  Vitals:   10/03/22 0923  TempSrc:   PainSc: 0-No pain                 Molli Barrows

## 2022-10-12 ENCOUNTER — Other Ambulatory Visit: Payer: Self-pay | Admitting: Urology

## 2022-10-12 ENCOUNTER — Telehealth: Payer: Self-pay

## 2022-10-12 DIAGNOSIS — N201 Calculus of ureter: Secondary | ICD-10-CM

## 2022-10-12 NOTE — Progress Notes (Signed)
Surgical Physician Order Form [facility]  * Scheduling expectation : Next Available  *Length of Case: 60 min  *Clearance needed: no  *Anticoagulation Instructions: N/A  *Aspirin Instructions: N/A  *Post-op visit Date/Instructions: 2 week cystoscopy/stent removal  *Diagnosis: Left UPJ Stone  *Procedure:  Left   Ureteroscopy w/laser lithotripsy & stent exchange [id number])   Additional orders: N/A  -Admit type: OUTpatient  -Anesthesia: General  -VTE Prophylaxis Standing Order SCD's       Other:   -Standing Lab Orders Per Anesthesia    Lab other: None  -Standing Test orders EKG/Chest x-ray per Anesthesia       Test other:   - Medications:  Ceftriaxone(Rocephin) 1gm IV  -Other orders:  N/A

## 2022-10-12 NOTE — Progress Notes (Signed)
   [facility] Posting From  Surgery Date: Date: 10/16/2022  Surgeon: Dr. John Giovanni, MD  Inpt ( No  )   Outpt (Yes)   Obs ( No  )   Diagnosis: N20.1 Left Ureteral Stone   -CPT: [id number]  Surgery: Left Ureteroscopy with Laser Lithotripsy and Stent Exchange  Stop Anticoagulations: No  Cardiac/Medical/Pulmonary Clearance needed: no  *Orders entered into EPIC  Date: 10/12/22   *Case booked in [place]  Date: 10/12/22  *Notified pt of Surgery: Date: 10/12/22  PRE-OP UA & CX: no  *Placed into Prior Authorization Work [place] Date: 10/12/22  Assistant/laser/rep:No

## 2022-10-12 NOTE — Telephone Encounter (Signed)
I spoke with Timothy Wilkins. We have discussed possible surgery dates and Tuesday February 20th, 2024 was agreed upon by all parties. Patient given information about surgery date, what to expect pre-operatively and post operatively.  We discussed that a Pre-Admission Testing office will be calling to set up the pre-op visit that will take place prior to surgery, and that these appointments are typically done over the phone with a Pre-Admissions RN. Informed patient that our office will communicate any additional care to be provided after surgery. Patients questions or concerns were discussed during our call. Advised to call our office should there be any additional information, questions or concerns that arise. Patient verbalized understanding.

## 2022-10-15 NOTE — Anesthesia Preprocedure Evaluation (Signed)
Anesthesia Evaluation  Patient identified by MRN, date of birth, ID band Patient awake    Reviewed: Allergy & Precautions, H&P , NPO status , Patient's Chart, lab work & pertinent test results, reviewed documented beta blocker date and time   Airway Mallampati: II  TM Distance: >3 FB Neck ROM: full    Dental  (+) Teeth Intact   Pulmonary neg pulmonary ROS   Pulmonary exam normal        Cardiovascular Exercise Tolerance: Good hypertension, On Medications Normal cardiovascular exam Rhythm:regular Rate:Normal     Neuro/Psych negative neurological ROS  negative psych ROS   GI/Hepatic negative GI ROS, Neg liver ROS,,,  Endo/Other  negative endocrine ROS    Renal/GU   negative genitourinary   Musculoskeletal negative musculoskeletal ROS (+)    Abdominal Normal abdominal exam  (+)   Peds negative pediatric ROS (+)  Hematology negative hematology ROS (+)   Anesthesia Other Findings Left Ureteral Stone  Past Medical History: No date: History of kidney stones No date: Hypertension Past Surgical History: No date: APPENDECTOMY 07/31/2016: ORIF WRIST FRACTURE; Left     Comment:  Procedure: OPEN REDUCTION INTERNAL FIXATION (ORIF) WRIST              FRACTURE;  Surgeon: Hessie Knows, MD;  Location: [facility]               ORS;  Service: Orthopedics;  Laterality: Left;   Reproductive/Obstetrics negative OB ROS                             Anesthesia Physical Anesthesia Plan  ASA: 2  Anesthesia Plan: General   Post-op Pain Management: Minimal or no pain anticipated   Induction: Intravenous  PONV Risk Score and Plan: 3 and Ondansetron and Dexamethasone  Airway Management Planned: LMA  Additional Equipment:   Intra-op Plan:   Post-operative Plan: Extubation in OR  Informed Consent: I have reviewed the patients History and Physical, chart, labs and discussed the procedure including the risks,  benefits and alternatives for the proposed anesthesia with the patient or authorized representative who has indicated his/her understanding and acceptance.     Dental Advisory Given  Plan Discussed with: CRNA  Anesthesia Plan Comments:        Anesthesia Quick Evaluation

## 2022-10-16 ENCOUNTER — Ambulatory Visit: Payer: Medicare HMO | Admitting: Anesthesiology

## 2022-10-16 ENCOUNTER — Encounter
Admission: RE | Disposition: A | Discharge status: Home / Self Care | Payer: Self-pay | Source: Ambulatory Visit | Attending: Urology

## 2022-10-16 ENCOUNTER — Other Ambulatory Visit: Payer: Self-pay

## 2022-10-16 ENCOUNTER — Ambulatory Visit: Payer: Medicare HMO

## 2022-10-16 ENCOUNTER — Encounter: Payer: Self-pay | Admitting: Urology

## 2022-10-16 ENCOUNTER — Ambulatory Visit
Admission: RE | Admit: 2022-10-16 | Discharge: 2022-10-16 | Disposition: A | Discharge status: Home / Self Care | Payer: Medicare HMO | Source: Ambulatory Visit | Attending: Urology | Admitting: Urology

## 2022-10-16 DIAGNOSIS — N132 Hydronephrosis with renal and ureteral calculous obstruction: Secondary | ICD-10-CM | POA: Insufficient documentation

## 2022-10-16 DIAGNOSIS — I10 Essential (primary) hypertension: Secondary | ICD-10-CM | POA: Diagnosis not present

## 2022-10-16 DIAGNOSIS — N201 Calculus of ureter: Secondary | ICD-10-CM

## 2022-10-16 DIAGNOSIS — N200 Calculus of kidney: Secondary | ICD-10-CM | POA: Diagnosis not present

## 2022-10-16 HISTORY — PX: CYSTOSCOPY/URETEROSCOPY/HOLMIUM LASER/STENT PLACEMENT: SHX6546

## 2022-10-16 SURGERY — CYSTOSCOPY/URETEROSCOPY/HOLMIUM LASER/STENT PLACEMENT
Anesthesia: General | Site: Ureter | Laterality: Left

## 2022-10-16 MED ORDER — OXYCODONE HCL 5 MG/5ML PO SOLN: 5.0000 mg | Freq: Once | ORAL | Status: DC | PRN

## 2022-10-16 MED ORDER — DROPERIDOL 2.5 MG/ML IJ SOLN: 0.6250 mg | Freq: Once | INTRAMUSCULAR | Status: DC | PRN

## 2022-10-16 MED ORDER — SODIUM CHLORIDE 0.9 % IV SOLN: INTRAVENOUS | Status: DC

## 2022-10-16 MED ORDER — LIDOCAINE HCL (CARDIAC) PF 100 MG/5ML IV SOSY
PREFILLED_SYRINGE | INTRAVENOUS | Status: DC | PRN
  Administered 2022-10-16 (×2): 50 mg via INTRAVENOUS

## 2022-10-16 MED ORDER — ONDANSETRON HCL 4 MG/2ML IJ SOLN
INTRAMUSCULAR | Status: DC | PRN
  Administered 2022-10-16: 4 mg via INTRAVENOUS

## 2022-10-16 MED ORDER — ONDANSETRON HCL 4 MG/2ML IJ SOLN
INTRAMUSCULAR | Status: AC
  Filled 2022-10-16: qty 2

## 2022-10-16 MED ORDER — PROMETHAZINE HCL 25 MG/ML IJ SOLN: 6.2500 mg | INTRAMUSCULAR | Status: DC | PRN

## 2022-10-16 MED ORDER — FENTANYL CITRATE (PF) 100 MCG/2ML IJ SOLN: 25.0000 ug | INTRAMUSCULAR | Status: DC | PRN

## 2022-10-16 MED ORDER — PROPOFOL 10 MG/ML IV BOLUS
INTRAVENOUS | Status: DC | PRN
  Administered 2022-10-16: 150 mg via INTRAVENOUS

## 2022-10-16 MED ORDER — KETOROLAC TROMETHAMINE 30 MG/ML IJ SOLN
INTRAMUSCULAR | Status: DC | PRN
  Administered 2022-10-16: 30 mg via INTRAVENOUS

## 2022-10-16 MED ORDER — FENTANYL CITRATE (PF) 100 MCG/2ML IJ SOLN
INTRAMUSCULAR | Status: AC
  Filled 2022-10-16: qty 2

## 2022-10-16 MED ORDER — IOHEXOL 180 MG/ML  SOLN
INTRAMUSCULAR | Status: DC | PRN
  Administered 2022-10-16: 20 mL
  Administered 2022-10-16: 10 mL
  Administered 2022-10-16: 20 mL

## 2022-10-16 MED ORDER — SODIUM CHLORIDE 0.9 % IR SOLN
Status: DC | PRN
  Administered 2022-10-16: 3000 mL via INTRAVESICAL

## 2022-10-16 MED ORDER — ORAL CARE MOUTH RINSE: 15.0000 mL | Freq: Once | OROMUCOSAL | Status: DC

## 2022-10-16 MED ORDER — KETOROLAC TROMETHAMINE 30 MG/ML IJ SOLN
INTRAMUSCULAR | Status: AC
  Filled 2022-10-16: qty 1

## 2022-10-16 MED ORDER — FENTANYL CITRATE (PF) 100 MCG/2ML IJ SOLN
INTRAMUSCULAR | Status: DC | PRN
  Administered 2022-10-16 (×2): 50 ug via INTRAVENOUS

## 2022-10-16 MED ORDER — SODIUM CHLORIDE 0.9 % IV SOLN
1.0000 g | INTRAVENOUS | Status: AC
  Administered 2022-10-16: 1 g via INTRAVENOUS
  Filled 2022-10-16: qty 1

## 2022-10-16 MED ORDER — ACETAMINOPHEN 10 MG/ML IV SOLN: 1000.0000 mg | Freq: Once | INTRAVENOUS | Status: DC | PRN

## 2022-10-16 MED ORDER — OXYCODONE HCL 5 MG PO TABS: 5.0000 mg | ORAL_TABLET | Freq: Once | ORAL | Status: DC | PRN

## 2022-10-16 MED ORDER — CHLORHEXIDINE GLUCONATE 0.12 % MT SOLN: 15.0000 mL | Freq: Once | OROMUCOSAL | Status: DC

## 2022-10-16 MED ORDER — DEXMEDETOMIDINE HCL IN NACL 200 MCG/50ML IV SOLN
INTRAVENOUS | Status: DC | PRN
  Administered 2022-10-16: 8 ug via INTRAVENOUS
  Administered 2022-10-16 (×2): 4 ug via INTRAVENOUS

## 2022-10-16 MED ORDER — PROPOFOL 10 MG/ML IV BOLUS
INTRAVENOUS | Status: AC
  Filled 2022-10-16: qty 20

## 2022-10-16 SURGICAL SUPPLY — 24 items
BAG DRAIN SIEMENS DORNER NS (MISCELLANEOUS) ×1 IMPLANT
BASKET ZERO TIP 1.9FR (BASKET) IMPLANT
BRUSH SCRUB EZ 1% IODOPHOR (MISCELLANEOUS) ×1 IMPLANT
CATH URET FLEX-TIP 2 LUMEN 10F (CATHETERS) IMPLANT
CATH URETL OPEN END 6X70 (CATHETERS) IMPLANT
CNTNR URN SCR LID CUP LEK RST (MISCELLANEOUS) IMPLANT
CONT SPEC 4OZ STRL OR WHT (MISCELLANEOUS)
DRAPE UTILITY 15X26 TOWEL STRL (DRAPES) ×1 IMPLANT
FIBER LASER MOSES 200 DFL (Laser) IMPLANT
GLOVE SURG UNDER POLY LF SZ7.5 (GLOVE) ×1 IMPLANT
GOWN STRL REUS W/ TWL LRG LVL3 (GOWN DISPOSABLE) ×1 IMPLANT
GOWN STRL REUS W/ TWL XL LVL3 (GOWN DISPOSABLE) ×1 IMPLANT
GOWN STRL REUS W/TWL LRG LVL3 (GOWN DISPOSABLE) ×1
GOWN STRL REUS W/TWL XL LVL3 (GOWN DISPOSABLE) ×1
GUIDEWIRE STR DUAL SENSOR (WIRE) ×1 IMPLANT
IV NS IRRIG 3000ML ARTHROMATIC (IV SOLUTION) ×1 IMPLANT
KIT TURNOVER CYSTO (KITS) ×1 IMPLANT
PACK CYSTO AR (MISCELLANEOUS) ×1 IMPLANT
SET CYSTO W/LG BORE CLAMP LF (SET/KITS/TRAYS/PACK) ×1 IMPLANT
SHEATH NAVIGATOR HD 12/14X36 (SHEATH) IMPLANT
STENT URET 6FRX26 CONTOUR (STENTS) IMPLANT
SURGILUBE 2OZ TUBE FLIPTOP (MISCELLANEOUS) ×1 IMPLANT
VALVE UROSEAL ADJ ENDO (VALVE) IMPLANT
WATER STERILE IRR 500ML POUR (IV SOLUTION) ×1 IMPLANT

## 2022-10-16 NOTE — Discharge Instructions (Addendum)
DISCHARGE INSTRUCTIONS FOR KIDNEY STONE/URETERAL STENT   MEDICATIONS:  1. Resume all your other meds from home.  2.  AZO (over-the-counter) can help with the burning/stinging when you urinate.   ACTIVITY:  1. May resume regular activities in 24 hours. 2. No driving while on narcotic pain medications  3. Drink plenty of water  4. Continue to walk at home - you can still get blood clots when you are at home, so keep active, but Najir't over do it.  5. May return to work/school tomorrow or when you feel ready    SIGNS/SYMPTOMS TO CALL:  Common postoperative symptoms include urinary frequency, urgency, bladder spasm and blood in the urine  Please call us if you have a fever greater than 101.5, uncontrolled nausea/vomiting, uncontrolled pain, dizziness, unable to urinate, excessively bloody urine, chest pain, shortness of breath, leg swelling, leg pain, or any other concerns or questions.   You can reach [place] at [phone].   FOLLOW-UP:  1. You will be contacted for a follow-up appointment for stent removal   AMBULATORY SURGERY  DISCHARGE INSTRUCTIONS  The drugs that you were given will stay in your system until tomorrow so for the next 24 hours you should not:  Drive an automobile Make any legal decisions Drink any alcoholic beverage  You may resume regular meals tomorrow.  Today it is better to start with liquids and gradually work up to solid foods.  You may eat anything you prefer, but it is better to start with liquids, then soup and crackers, and gradually work up to solid foods.  Please notify your doctor immediately if you have any unusual bleeding, trouble breathing, redness and pain at the surgery site, drainage, fever, or pain not relieved by medication.  Additional Instructions:  Please contact your physician with any problems or Same Day Surgery at [phone], Monday through Friday 6 am to 4 pm, or [facility] at [facility] number at [phone].

## 2022-10-16 NOTE — H&P (Signed)
Urology H&P   History of Present Illness: Timothy Wilkins is a 67 y.o. with a impacted left UPJ calculus who underwent partial fragmentation and stent placement 10/02/2022.  He presents today for follow-up ureteroscopy/laser lithotripsy  Past Medical History:  Diagnosis Date   History of kidney stones    Hypertension     Past Surgical History:  Procedure Laterality Date   APPENDECTOMY     CYSTOSCOPY/URETEROSCOPY/HOLMIUM LASER/STENT PLACEMENT Left 10/02/2022   Procedure: CYSTOSCOPY/URETEROSCOPY/HOLMIUM LASER/STENT PLACEMENT;  Surgeon: Abbie Sons, MD;  Location: [facility] ORS;  Service: Urology;  Laterality: Left;   ORIF WRIST FRACTURE Left 07/31/2016   Procedure: OPEN REDUCTION INTERNAL FIXATION (ORIF) WRIST FRACTURE;  Surgeon: Hessie Knows, MD;  Location: [facility] ORS;  Service: Orthopedics;  Laterality: Left;    Home Medications:  Current Meds  Medication Sig   doxazosin (CARDURA) 4 MG tablet Take 4 mg by mouth daily.   HYDROcodone-acetaminophen (NORCO/VICODIN) 5-325 MG tablet Take 1 tablet by mouth every 6 (six) hours as needed for moderate pain.   olmesartan-hydrochlorothiazide (BENICAR HCT) 40-12.5 MG tablet Take 1 tablet by mouth daily.   oxybutynin (DITROPAN) 5 MG tablet 1 tab tid prn frequency,urgency, bladder spasm    Allergies:  Allergies  Allergen Reactions   Cortisone Rash    In large doses    No family history on file.  Social History:  reports that he has never smoked. He has never used smokeless tobacco. He reports that he does not drink alcohol and does not use drugs.  ROS: A complete review of systems was performed.  All systems are negative except for pertinent findings as noted.  Physical Exam:  Vital signs in last 24 hours: Temp:  [97.3 F (36.3 C)] 97.3 F (36.3 C) (02/20 0757) Pulse Rate:  [60] 60 (02/20 0757) Resp:  [18] 18 (02/20 0757) BP: (170-193)/(91-105) 170/91 (02/20 0827) SpO2:  [99 %] 99 % (02/20 0757) Weight:  [86.2 kg] 86.2 kg (02/20  0757) Constitutional:  Alert and oriented, No acute distress HEENT: [place] AT, moist mucus membranes.  Trachea midline, no masses Cardiovascular: Regular rate and rhythm Respiratory: Normal respiratory effort, lungs clear bilaterally Psychiatric: Normal mood and affect   Laboratory Data:  No results for input(s): "WBC", "HGB", "HCT" in the last 72 hours. No results for input(s): "NA", "K", "CL", "CO2", "GLUCOSE", "BUN", "CREATININE", "CALCIUM" in the last 72 hours. No results for input(s): "LABPT", "INR" in the last 72 hours. No results for input(s): "LABURIN" in the last 72 hours. Results for orders placed or performed in visit on 09/19/22  Microscopic Examination     Status: Abnormal   Collection Time: 09/19/22  3:01 PM   Urine  Result Value Ref Range Status   WBC, UA 0-5 0 - 5 /hpf Final   RBC, Urine 0-2 0 - 2 /hpf Final   Epithelial Cells (non renal) 0-10 0 - 10 /hpf Final   Casts Present (A) None seen /lpf Final   Cast Type Hyaline casts N/A Final   Mucus, UA Present (A) Not Estab. Final   Bacteria, UA Few None seen/Few Final  CULTURE, URINE COMPREHENSIVE     Status: None   Collection Time: 09/19/22  3:01 PM   UR  Result Value Ref Range Status   Urine Culture, Comprehensive Final report  Final   Organism ID, Bacteria Comment  Final    Comment: No growth in 36 - 48 hours.    Impression/Plan:  67 y.o. male with impacted left ureteral calculus status  post partial fragmentation and stent placement.  He presents today for follow-up ureteroscopy with laser lithotripsy. The procedure was again reviewed in detail including potential risks of bleeding, infection, ureteral injury.  Due to impacted stone he is at risk of developing ureteral stricture.  All questions were answered and he desires to proceed    10/16/2022, 10:05 AM  John Giovanni,  MD

## 2022-10-16 NOTE — Op Note (Signed)
Preoperative diagnosis: Left nephrolithiasis   Postoperative diagnosis: Left nephrolithiasis  Procedure:  Cystoscopy Left ureteroscopy and stone removal Ureteroscopic laser lithotripsy Left ureteral stent exchange (323F/26 cm)  Left retrograde pyelography with interpretation  Surgeon: Nicki Reaper C. Rennee Coyne, M.D.  Anesthesia: General  Complications: None  Intraoperative findings:  Cystoscopy-urethra normal in caliber without stricture.  Tightness distal prostatic urethra without definite stricture; elevated bladder neck.  Mild-moderate lateral lobe enlargement prostate.  Bladder mucosa without erythema, solid or papillary lesions.  Mild inflammatory changes left hemitrigone secondary to indwelling stent.  Mild bladder trabeculation.  UOs normal-appearing; no efflux seen from left UO Ureteroscopy-small fragments noted in the mid and distal ureter.  The previously noted impacted calculus was mobile and this portion of the ureter was dilated Pyeloscopy marked renal pelvic and calyceal dilation.  No mucosal lesions or calyceal calculi noted.  Some fragments from treated stone in the dependent portion of the renal pelvis Left retrograde pyelography post procedure showed severe hydronephrosis.  No contrast extravasation noted  EBL: Minimal  Specimens: Calculus fragments for analysis   Indication: Timothy Wilkins is a 68 y.o. male with an impacted left UPJ calculus which was partially treated 10/02/2022.  He presents today for follow-up ureteroscopy with laser lithotripsy.  After reviewing the management options for treatment, the patient elected to proceed with the above surgical procedure(s). We have discussed the potential benefits and risks of the procedure, side effects of the proposed treatment, the likelihood of the patient achieving the goals of the procedure, and any potential problems that might occur during the procedure or recuperation. Informed consent has been obtained.  Description  of procedure:  The patient was taken to the operating room and general anesthesia was induced.  The patient was placed in the dorsal lithotomy position, prepped and draped in the usual sterile fashion, and preoperative antibiotics were administered. A preoperative time-out was performed.   A 21 French cystoscope was lubricated inserted per urethra and advanced proximally under direct vision with findings as described above.    The stent was grasped with endoscopic forceps and brought out through the urethral meatus.  A 0.038 Sensor wire was then advanced through the stent and into the renal pelvis under fluoroscopic guidance.  The ureteral stent was then removed.  A single channel digital flexible ureteroscope was passed per urethra and advanced into the bladder.  The ureteroscope was easily placed into the ureter alongside the guidewire and advanced proximally with findings described above  The remaining calculus was dusted with a 200 m Moses holmium laser fiber at a setting of 0.3J/80 Hz.  Once completely treated the ureteroscope was advanced into the renal pelvis.  Some larger fragments were noted in the dependent portion of the renal pelvis and these were further treated with noncontact laser lithotripsy at a setting of 0.6J/40 Hz.  1 larger fragment was placed in the basket and removed and sent for analysis.  The ureteroscope was then readvanced into the ureter to the renal pelvis.  Retrograde pyelogram was performed with findings as described above.  The stone particles in the dependent portion of the renal pelvis were again examined and none were identified larger than the tip of the laser fiber.  The ureteroscope was removed under direct vision and no significant ureteral fragments were identified.  The guidewire was backloaded on the cystoscope and a 323F/26 cm contour ureteral stent was placed.  Good position in the renal pelvis was noted under fluoroscopy and in the bladder under direct  vision.  The bladder was emptied and the cystoscope was removed.  The patient appeared to tolerate the procedure well and without complications.  After anesthetic reversal the patient was transported to the PACU in stable condition.   Plan: Due to severe hydronephrosis and parenchymal thinning a renal scan will be performed to assess for differential function Indwelling stent ~ 2 weeks   Timothy Giovanni, MD

## 2022-10-16 NOTE — Transfer of Care (Signed)
Immediate Anesthesia Transfer of Care Note  Patient: Timothy Wilkins  Procedure(s) Performed: CYSTOSCOPY/URETEROSCOPY/HOLMIUM LASER/STENT EXCHANGE (Left: Ureter)  Patient Location: PACU  Anesthesia Type:General  Level of Consciousness: drowsy and patient cooperative  Airway & Oxygen Therapy: Patient Spontanous Breathing and Patient connected to nasal cannula oxygen  Post-op Assessment: Report given to RN and Post -op Vital signs reviewed and stable  Post vital signs: Reviewed and stable  Last Vitals:  Vitals Value Taken Time  BP 154/82 10/16/22 1203  Temp 36.5 C 10/16/22 1203  Pulse 53 10/16/22 1208  Resp 0 10/16/22 1208  SpO2 98 % 10/16/22 1208  Vitals shown include unvalidated device data.  Last Pain:  Vitals:   10/16/22 0757  PainSc: 0-No pain         Complications: No notable events documented.

## 2022-10-16 NOTE — Anesthesia Procedure Notes (Signed)
Procedure Name: LMA Insertion Date/Time: 10/16/2022 10:16 AM  Performed by: Jonna Clark, CRNAPre-anesthesia Checklist: Patient identified, Patient being monitored, Timeout performed, Emergency Drugs available and Suction available Patient Re-evaluated:Patient Re-evaluated prior to induction Oxygen Delivery Method: Circle system utilized Preoxygenation: Pre-oxygenation with 100% oxygen Induction Type: IV induction Ventilation: Mask ventilation without difficulty LMA: LMA inserted LMA Size: 4.0 Tube type: Oral Number of attempts: 1 Placement Confirmation: positive ETCO2 and breath sounds checked- equal and bilateral Tube secured with: Tape Dental Injury: Teeth and Oropharynx as per pre-operative assessment

## 2022-10-16 NOTE — Anesthesia Postprocedure Evaluation (Signed)
Anesthesia Post Note  Patient: Timothy Wilkins  Procedure(s) Performed: CYSTOSCOPY/URETEROSCOPY/HOLMIUM LASER/STENT EXCHANGE (Left: Ureter)  Patient location during evaluation: PACU Anesthesia Type: General Level of consciousness: awake and alert Pain management: pain level controlled Vital Signs Assessment: post-procedure vital signs reviewed and stable Respiratory status: spontaneous breathing, nonlabored ventilation and respiratory function stable Cardiovascular status: blood pressure returned to baseline and stable Postop Assessment: no apparent nausea or vomiting Anesthetic complications: no   No notable events documented.   Last Vitals:  Vitals:   10/16/22 1249 10/16/22 1317  BP: (!) 160/97 (!) 160/88  Pulse: 66 (!) 57  Resp: 16 16  Temp: (!) 35.8 C (!) 36.1 C  SpO2: 99% 99%    Last Pain:  Vitals:   10/16/22 1317  TempSrc:   PainSc: 0-No pain                 [place] Ouch

## 2022-10-16 NOTE — Interval H&P Note (Signed)
History and Physical Interval Note:  10/16/2022 10:08 AM  Timothy Wilkins  has presented today for surgery, with the diagnosis of Left Ureteral Stone.  The various methods of treatment have been discussed with the patient and family. After consideration of risks, benefits and other options for treatment, the patient has consented to  Procedure(s): CYSTOSCOPY/URETEROSCOPY/HOLMIUM LASER/STENT EXCHANGE (Left) as a surgical intervention.  The patient's history has been reviewed, patient examined, no change in status, stable for surgery.  I have reviewed the patient's chart and labs.  Questions were answered to the patient's satisfaction.     [place]

## 2022-10-17 ENCOUNTER — Other Ambulatory Visit: Payer: Self-pay | Admitting: Urology

## 2022-10-17 MED ORDER — OXYBUTYNIN CHLORIDE 5 MG PO TABS: ORAL_TABLET | ORAL | 0 refills | Status: DC

## 2022-10-24 ENCOUNTER — Other Ambulatory Visit: Payer: Self-pay | Admitting: Urology

## 2022-10-24 DIAGNOSIS — N1330 Unspecified hydronephrosis: Secondary | ICD-10-CM

## 2022-10-24 DIAGNOSIS — N261 Atrophy of kidney (terminal): Secondary | ICD-10-CM

## 2022-10-24 LAB — CALCULI, WITH PHOTOGRAPH (CLINICAL LAB)
Calcium Oxalate Dihydrate: 20 %
Calcium Oxalate Monohydrate: 80 %
Weight Calculi: 4 mg

## 2022-10-29 SURGERY — Surgical Case
Anesthesia: *Unknown

## 2022-10-31 ENCOUNTER — Encounter
Admission: RE | Admit: 2022-10-31 | Discharge: 2022-10-31 | Disposition: A | Discharge status: Home / Self Care | Payer: Medicare HMO | Source: Ambulatory Visit | Attending: Urology | Admitting: Urology

## 2022-10-31 DIAGNOSIS — N1330 Unspecified hydronephrosis: Secondary | ICD-10-CM | POA: Insufficient documentation

## 2022-10-31 MED ORDER — TECHNETIUM TC 99M MERTIATIDE
5.0000 | Freq: Once | INTRAVENOUS | Status: AC | PRN
  Administered 2022-10-31: 5.04 via INTRAVENOUS

## 2022-10-31 MED ORDER — FUROSEMIDE 10 MG/ML IJ SOLN
43.1000 mg | Freq: Once | INTRAMUSCULAR | Status: AC
  Administered 2022-10-31: 43.1 mg via INTRAVENOUS
  Filled 2022-10-31: qty 4.3

## 2022-11-15 ENCOUNTER — Ambulatory Visit (INDEPENDENT_AMBULATORY_CARE_PROVIDER_SITE_OTHER): Payer: Medicare HMO | Admitting: Urology

## 2022-11-15 ENCOUNTER — Encounter: Payer: Self-pay | Admitting: Urology

## 2022-11-15 VITALS — BP 178/111 | HR 74 | Ht 72.0 in | Wt 185.0 lb

## 2022-11-15 DIAGNOSIS — Z466 Encounter for fitting and adjustment of urinary device: Secondary | ICD-10-CM | POA: Diagnosis not present

## 2022-11-15 DIAGNOSIS — N1330 Unspecified hydronephrosis: Secondary | ICD-10-CM

## 2022-11-15 DIAGNOSIS — Z87442 Personal history of urinary calculi: Secondary | ICD-10-CM

## 2022-11-15 DIAGNOSIS — N200 Calculus of kidney: Secondary | ICD-10-CM

## 2022-11-15 LAB — URINALYSIS, COMPLETE
Bilirubin, UA: NEGATIVE
Glucose, UA: NEGATIVE
Ketones, UA: NEGATIVE
Nitrite, UA: NEGATIVE
Specific Gravity, UA: >1.03 — ABNORMAL HIGH (ref 1.005–1.030)
Urobilinogen, Ur: 0.2 mg/dL (ref 0.2–1.0)
pH, UA: 6 (ref 5.0–7.5)

## 2022-11-15 LAB — MICROSCOPIC EXAMINATION: RBC, Urine: >30 /hpf — AB (ref 0–2)

## 2022-11-15 MED ORDER — CIPROFLOXACIN HCL 500 MG PO TABS
500.0000 mg | ORAL_TABLET | Freq: Once | ORAL | Status: AC
  Administered 2022-11-15: 500 mg via ORAL

## 2022-11-15 NOTE — Progress Notes (Signed)
   Indications: Patient is 67 y.o., who is s/p staged ureteroscopic removal of an impact left UPJ calculus 10/02/2022 and 10/16/2022.  He had no postoperative complaints.  The patient is presenting today for stent removal.  CT did show severe hydronephrosis with parenchymal thinning.  A renal scan was obtained which shows differential function  R 73.3%/L 26.7%.  Stone analysis CaOxMono/CaOxDi 80/20%  Procedure:  Flexible Cystoscopy with stent removal [id number])  Timeout was performed and the correct patient, procedure and participants were identified.    Description:  The patient was prepped and draped in the usual sterile fashion. Flexible cystosopy was performed.  The stent was visualized, grasped, and removed intact without difficulty. The patient tolerated the procedure well.  A single dose of oral antibiotics was given.  Complications:  None  Plan:  Discussed he is at increased risk for developing a ureteral stricture and will schedule follow-up visit with [facility] in 6-8 weeks Instructed to call for fever/flank pain post stent removal   John Giovanni, MD

## 2022-12-17 ENCOUNTER — Ambulatory Visit
Admission: RE | Admit: 2022-12-17 | Discharge: 2022-12-17 | Disposition: A | Discharge status: Home / Self Care | Payer: Medicare HMO | Source: Ambulatory Visit | Attending: Urology | Admitting: Urology

## 2022-12-17 DIAGNOSIS — N1330 Unspecified hydronephrosis: Secondary | ICD-10-CM | POA: Diagnosis present

## 2023-01-10 ENCOUNTER — Ambulatory Visit: Payer: Medicare HMO | Admitting: Urology

## 2023-01-10 ENCOUNTER — Encounter: Payer: Self-pay | Admitting: Urology

## 2023-01-10 VITALS — BP 170/98 | HR 66 | Ht 72.0 in | Wt 185.0 lb

## 2023-01-10 DIAGNOSIS — N261 Atrophy of kidney (terminal): Secondary | ICD-10-CM

## 2023-01-10 DIAGNOSIS — N1330 Unspecified hydronephrosis: Secondary | ICD-10-CM | POA: Diagnosis not present

## 2023-01-10 DIAGNOSIS — N200 Calculus of kidney: Secondary | ICD-10-CM

## 2023-01-10 NOTE — Progress Notes (Signed)
Marcelle Overlie Plume,acting as a scribe for Riki Altes, MD.,have documented all relevant documentation on the behalf of Riki Altes, MD,as directed by  Riki Altes, MD while in the presence of Riki Altes, MD.  01/10/2023 3:14 PM   Timothy Wilkins [birth year] [id number]  Referring provider: Danella Penton, MD [phone] [facility] [address] [facility] [place],  [place] [address]  Chief Complaint  Patient presents with   Nephrolithiasis    HPI: Timothy Wilkins is a 67 y.o. male who presents today for follow up.  Status post staged ureteroscopic removal of an impacted left UPJ calculus in 09/2022 CT did show severe hydronephrosis with parenchymal thinning and a renal scan showed differential function of 26.7% of the left kidney Ureteral stent was removed on 3/21/204 and a follow up renal ultrasound in 6-8 weeks was recommended.  Ultrasound performed on 12/17/2022 showed severe hydronephrosis of the left kidney with cortical thinning He has done quite well since stent removal Denies flank, abdominal, or pelvic pain Has no bothersome LUTs Review of his prior retrograde pyelogram after stenting still showed severe hydronephrosis   PMH: Past Medical History:  Diagnosis Date   History of kidney stones    Hypertension     Surgical History: Past Surgical History:  Procedure Laterality Date   APPENDECTOMY     CYSTOSCOPY/URETEROSCOPY/HOLMIUM LASER/STENT PLACEMENT Left 10/02/2022   Procedure: CYSTOSCOPY/URETEROSCOPY/HOLMIUM LASER/STENT PLACEMENT;  Surgeon: Riki Altes, MD;  Location: [facility] ORS;  Service: Urology;  Laterality: Left;   CYSTOSCOPY/URETEROSCOPY/HOLMIUM LASER/STENT PLACEMENT Left 10/16/2022   Procedure: CYSTOSCOPY/URETEROSCOPY/HOLMIUM LASER/STENT EXCHANGE;  Surgeon: Riki Altes, MD;  Location: [facility] ORS;  Service: Urology;  Laterality: Left;   ORIF WRIST FRACTURE Left 07/31/2016   Procedure: OPEN REDUCTION INTERNAL FIXATION (ORIF) WRIST FRACTURE;   Surgeon: Kennedy Bucker, MD;  Location: [facility] ORS;  Service: Orthopedics;  Laterality: Left;    Home Medications:  Allergies as of 01/10/2023       Reactions   Cortisone Rash   In large doses        Medication List        Accurate as of Jan 10, 2023  3:14 PM. If you have any questions, ask your nurse or doctor.          amLODipine-valsartan 10-320 MG tablet Commonly known as: EXFORGE Take 0.5 tablets by mouth every evening.   doxazosin 4 MG tablet Commonly known as: CARDURA Take 4 mg by mouth daily.   HYDROcodone-acetaminophen 5-325 MG tablet Commonly known as: NORCO/VICODIN Take 1 tablet by mouth every 6 (six) hours as needed for moderate pain.   olmesartan-hydrochlorothiazide 40-12.5 MG tablet Commonly known as: BENICAR HCT Take 1 tablet by mouth daily.   oxybutynin 5 MG tablet Commonly known as: DITROPAN 1 tab tid prn frequency,urgency, bladder spasm        Allergies:  Allergies  Allergen Reactions   Cortisone Rash    In large doses    Social History:  reports that he has never smoked. He has never used smokeless tobacco. He reports that he does not drink alcohol and does not use drugs.   Physical Exam: BP (!) 176/98   Pulse 75   Ht 6' (1.829 m)   Wt 185 lb (83.9 kg)   BMI 25.09 kg/m   Constitutional:  Alert and oriented, No acute distress. HEENT: [place] AT Respiratory: Normal respiratory effort, no increased work of breathing. Psychiatric: Normal mood and affect.  Pertinent Imaging: Ultrasound was reviewed and  personally interpreted.   Ultrasound renal complete  Narrative CLINICAL DATA:  Hydronephrosis.  EXAM: RENAL / URINARY TRACT ULTRASOUND COMPLETE  COMPARISON:  Nuclear medicine renal scan October 31, 2022  FINDINGS: Right Kidney:  Renal measurements: 12.4 x 6.5 x 6.2 cm = volume: 259 mL. Contains a 3.6 cm cyst. No follow-up imaging recommended for the cyst.  Left Kidney:  Renal measurements: 16.1 x 7.8 x 6.1 cm = volume: 400 mL.  Severe hydronephrosis. Significant cortical thinning. The cortex measures 7 mm. Contains a 16 mm cyst. No follow-up imaging recommended for the cyst.  Bladder:  Appears normal for degree of bladder distention.  Other:  None.  IMPRESSION: 1. Severe left-sided hydronephrosis with significant cortical thinning. 2. No other abnormalities.   Electronically Signed By: Gerome Sam III M.D. On: 12/17/2022 12:14   Assessment & Plan:    1. Left hydronephrosis Most likely non-obstructive hydronephrosis due to long standing severe obstruction Discussed possibility of ureteral stricture and the best way to evaluate for recurrent obstruction would be a left retrograde pyelogram and if stricture is identified, dilation with stent placement. However, he is still high risk for recurrent stricture. The alternative of monitoring on renal ultrasound and development of symptoms was also discussed.  He has elected the latter, which is certainly reasonable  25% 6 month follow up with [facility]/ renal ultrasound prior Instructed to call earlier for development of left flank pain  I have reviewed the above documentation for accuracy and completeness, and I agree with the above.   Riki Altes, MD  [facility] [address] [place], [place] [address] [phone]

## 2023-01-10 NOTE — Progress Notes (Signed)
Leland Johns presents for an office/procedure visit. BP today is 170/98. He/She is complaint/noncompliant with BP medication. Greater than 140/90. Provider  notified. Pt advised to contact PCP. Pt voiced understanding.

## 2023-07-11 ENCOUNTER — Ambulatory Visit: Payer: Medicare HMO | Admitting: Urology

## 2023-07-11 ENCOUNTER — Ambulatory Visit
Admission: RE | Admit: 2023-07-11 | Discharge: 2023-07-11 | Disposition: A | Discharge status: Home / Self Care | Payer: Medicare HMO | Source: Ambulatory Visit | Attending: Urology | Admitting: Urology

## 2023-07-11 ENCOUNTER — Encounter: Payer: Self-pay | Admitting: Urology

## 2023-07-11 VITALS — BP 159/86 | HR 70 | Ht 72.0 in | Wt 189.0 lb

## 2023-07-11 DIAGNOSIS — N1330 Unspecified hydronephrosis: Secondary | ICD-10-CM | POA: Insufficient documentation

## 2023-07-11 DIAGNOSIS — N261 Atrophy of kidney (terminal): Secondary | ICD-10-CM

## 2023-07-11 NOTE — Progress Notes (Signed)
I,Amy L Pierron,acting as a scribe for Riki Altes, MD.,have documented all relevant documentation on the behalf of Riki Altes, MD,as directed by  Riki Altes, MD while in the presence of Riki Altes, MD.  07/11/2023 9:29 AM   Timothy Wilkins 04-Nov-1955 [id number]  Referring provider: Danella Penton, MD [phone] [facility] [address] [facility] [place],  [place] [address]  Chief Complaint  Patient presents with   Nephrolithiasis   Urologic History: 1. Left nephrolithiasis 1 cm impacted left UPJ calculus with severe hydronephrosis, felt to be long standing. Ureteroscopy 10/02/2022. Guide wire could not be advanced proximal to the calculus. He required partial fragmentation of the stone without guide wire in order to place a wire proximal to the calculus. Due to sub-optimal visualization stent was placed and he underwent follow up ureteroscopy 10/16/2022. Imaging with stent in place showed persistent severe hydronephrosis, felt to be non obstructive.  HPI: Timothy Wilkins is a 67 y.o. male presents today for routine 6 month follow up.  No complaints since last visit. Denies flank, abdominal, or pelvic pain. Renal ultrasound was ordered to be done prior to today's visit, however it did not get scheduled.   PMH: Past Medical History:  Diagnosis Date   History of kidney stones    Hypertension     Surgical History: Past Surgical History:  Procedure Laterality Date   APPENDECTOMY     CYSTOSCOPY/URETEROSCOPY/HOLMIUM LASER/STENT PLACEMENT Left 10/02/2022   Procedure: CYSTOSCOPY/URETEROSCOPY/HOLMIUM LASER/STENT PLACEMENT;  Surgeon: Riki Altes, MD;  Location: [facility];  Service: Urology;  Laterality: Left;   CYSTOSCOPY/URETEROSCOPY/HOLMIUM LASER/STENT PLACEMENT Left 10/16/2022   Procedure: CYSTOSCOPY/URETEROSCOPY/HOLMIUM LASER/STENT EXCHANGE;  Surgeon: Riki Altes, MD;  Location: [facility];  Service: Urology;  Laterality: Left;   ORIF WRIST FRACTURE  Left 07/31/2016   Procedure: OPEN REDUCTION INTERNAL FIXATION (ORIF) WRIST FRACTURE;  Surgeon: Kennedy Bucker, MD;  Location: [facility];  Service: Orthopedics;  Laterality: Left;    Home Medications:  Allergies as of 07/11/2023       Reactions   Cortisone Rash   In large doses        Medication List        Accurate as of July 11, 2023  9:29 AM. If you have any questions, ask your nurse or doctor.          STOP taking these medications    HYDROcodone-acetaminophen 5-325 MG tablet Commonly known as: NORCO/VICODIN Stopped by: Riki Altes   oxybutynin 5 MG tablet Commonly known as: DITROPAN Stopped by: Riki Altes       TAKE these medications    amLODipine-valsartan 10-320 MG tablet Commonly known as: EXFORGE Take 0.5 tablets by mouth every evening.   doxazosin 4 MG tablet Commonly known as: CARDURA Take 4 mg by mouth daily.   olmesartan-hydrochlorothiazide 40-12.5 MG tablet Commonly known as: BENICAR HCT Take 1 tablet by mouth daily.        Allergies:  Allergies  Allergen Reactions   Cortisone Rash    In large doses    Family History: History reviewed. No pertinent family history.  Social History:  reports that he has never smoked. He has never used smokeless tobacco. He reports that he does not drink alcohol and does not use drugs.   Physical Exam: BP (!) 159/86   Pulse 70   Ht 6' (1.829 m)   Wt 189 lb (85.7 kg)   BMI 25.63 kg/m   Constitutional:  Alert and oriented,  No acute distress. HEENT: [place] AT Respiratory: Normal respiratory effort, no increased work of breathing. Psychiatric: Normal mood and affect.   Assessment & Plan:    1. Left hydronephrosis History of severe left hydronephrosis felt to be secondary to chronic obstruction. KUB today with lower pole stone fragments. Schedule renal ultrasound and will notify with the results. 6 month follow up with a KUB.   I have reviewed the above documentation for accuracy and  completeness, and I agree with the above.   Riki Altes, MD  [facility] [address] [place], [place] [address] [phone]

## 2023-07-12 ENCOUNTER — Ambulatory Visit: Payer: Medicare HMO | Admitting: Urology

## 2023-07-16 ENCOUNTER — Ambulatory Visit
Admission: RE | Admit: 2023-07-16 | Discharge: 2023-07-16 | Disposition: A | Discharge status: Home / Self Care | Payer: Medicare HMO | Source: Ambulatory Visit | Attending: Urology | Admitting: Urology

## 2023-07-16 DIAGNOSIS — N1330 Unspecified hydronephrosis: Secondary | ICD-10-CM | POA: Insufficient documentation

## 2023-07-16 DIAGNOSIS — N261 Atrophy of kidney (terminal): Secondary | ICD-10-CM | POA: Insufficient documentation

## 2023-07-29 ENCOUNTER — Other Ambulatory Visit: Payer: Self-pay | Admitting: *Deleted

## 2023-07-29 DIAGNOSIS — Q6211 Congenital occlusion of ureteropelvic junction: Secondary | ICD-10-CM

## 2023-08-01 ENCOUNTER — Encounter
Admission: RE | Admit: 2023-08-01 | Discharge: 2023-08-01 | Disposition: A | Discharge status: Home / Self Care | Payer: Medicare HMO | Source: Ambulatory Visit | Attending: Urology | Admitting: Urology

## 2023-08-01 DIAGNOSIS — Q6211 Congenital occlusion of ureteropelvic junction: Secondary | ICD-10-CM | POA: Diagnosis present

## 2023-08-01 MED ORDER — FUROSEMIDE 10 MG/ML IJ SOLN
43.0000 mg | Freq: Once | INTRAMUSCULAR | Status: AC
  Administered 2023-08-01: 43 mg via INTRAVENOUS
  Filled 2023-08-01: qty 4.3

## 2023-08-01 MED ORDER — TECHNETIUM TC 99M MERTIATIDE
5.0000 | Freq: Once | INTRAVENOUS | Status: AC
  Administered 2023-08-01: 5.33 via INTRAVENOUS

## 2023-08-01 MED ORDER — FUROSEMIDE 10 MG/ML IJ SOLN
42.8500 mg | Freq: Once | INTRAMUSCULAR | Status: DC
  Filled 2023-08-01: qty 4.3

## 2023-08-06 ENCOUNTER — Encounter: Payer: Self-pay | Admitting: Urology

## 2023-12-31 ENCOUNTER — Other Ambulatory Visit: Payer: Self-pay | Admitting: *Deleted

## 2023-12-31 DIAGNOSIS — N200 Calculus of kidney: Secondary | ICD-10-CM

## 2024-01-08 ENCOUNTER — Ambulatory Visit
Admission: RE | Admit: 2024-01-08 | Discharge: 2024-01-08 | Disposition: A | Discharge status: Home / Self Care | Attending: Urology | Admitting: Urology

## 2024-01-08 ENCOUNTER — Encounter: Payer: Self-pay | Admitting: Urology

## 2024-01-08 ENCOUNTER — Ambulatory Visit: Payer: Self-pay | Admitting: Urology

## 2024-01-08 ENCOUNTER — Ambulatory Visit
Admission: RE | Admit: 2024-01-08 | Discharge: 2024-01-08 | Disposition: A | Discharge status: Home / Self Care | Source: Ambulatory Visit | Attending: Urology | Admitting: Urology

## 2024-01-08 VITALS — BP 186/105 | HR 76 | Ht 72.0 in | Wt 185.0 lb

## 2024-01-08 DIAGNOSIS — N200 Calculus of kidney: Secondary | ICD-10-CM | POA: Insufficient documentation

## 2024-01-08 DIAGNOSIS — N261 Atrophy of kidney (terminal): Secondary | ICD-10-CM | POA: Diagnosis not present

## 2024-01-08 NOTE — Progress Notes (Signed)
 I, Maysun Jamey Mccallum, acting as a scribe for Geraline Knapp, MD., have documented all relevant documentation on the behalf of Geraline Knapp, MD, as directed by Geraline Knapp, MD while in the presence of Geraline Knapp, MD.  01/08/2024 3:08 PM   Timothy Wilkins [birth year] [id number]  Referring provider: Sari Cunning, MD [phone] [facility] [address] [facility] [place],  [place] [address]  Chief Complaint  Patient presents with   Nephrolithiasis   Urologic History: 1. Left nephrolithiasis -1 cm impacted left UPJ calculus with severe hydronephrosis, felt to be long standing. -Ureteroscopy 10/02/2022. Guide wire could not be advanced proximal to the calculus. He required partial fragmentation of the stone without guide wire in order to place a wire proximal to the calculus. Due to sub-optimal visualization stent was placed and he underwent follow up ureteroscopy 10/16/2022. -Imaging with stent in place showed persistent severe hydronephrosis, felt to be non obstructive.  HPI: Timothy Wilkins is a 68 y.o. male presents for a 6 month follow-up.  No problems since last visit.  Renal ultrasound performed 07/16/2023 showed left cortical thinning and severe hydronephrosis, and a 13mm right upper pole calculus. There was no evidence of a stone on a prior ultrasound 12/17/22.  Lasix  renal scan performed 08/01/2023 showed 10.5% function of the left kidney and 89.5% function of the right kidney.   PMH: Past Medical History:  Diagnosis Date   History of kidney stones    Hypertension     Surgical History: Past Surgical History:  Procedure Laterality Date   APPENDECTOMY     CYSTOSCOPY/URETEROSCOPY/HOLMIUM LASER/STENT PLACEMENT Left 10/02/2022   Procedure: CYSTOSCOPY/URETEROSCOPY/HOLMIUM LASER/STENT PLACEMENT;  Surgeon: Geraline Knapp, MD;  Location: [facility];  Service: Urology;  Laterality: Left;   CYSTOSCOPY/URETEROSCOPY/HOLMIUM LASER/STENT PLACEMENT Left 10/16/2022    Procedure: CYSTOSCOPY/URETEROSCOPY/HOLMIUM LASER/STENT EXCHANGE;  Surgeon: Geraline Knapp, MD;  Location: [facility];  Service: Urology;  Laterality: Left;   ORIF WRIST FRACTURE Left 07/31/2016   Procedure: OPEN REDUCTION INTERNAL FIXATION (ORIF) WRIST FRACTURE;  Surgeon: Molli Angelucci, MD;  Location: [facility];  Service: Orthopedics;  Laterality: Left;    Home Medications:  Allergies as of 01/08/2024       Reactions   Cortisone Rash   In large doses        Medication List        Accurate as of Jan 08, 2024  3:08 PM. If you have any questions, ask your nurse or doctor.          amLODipine-valsartan 10-320 MG tablet Commonly known as: EXFORGE Take 0.5 tablets by mouth every evening.   doxazosin 4 MG tablet Commonly known as: CARDURA Take 4 mg by mouth daily.   olmesartan-hydrochlorothiazide 40-12.5 MG tablet Commonly known as: BENICAR HCT Take 1 tablet by mouth daily.        Allergies:  Allergies  Allergen Reactions   Cortisone Rash    In large doses   Social History:  reports that he has never smoked. He has never used smokeless tobacco. He reports that he does not drink alcohol and does not use drugs.   Physical Exam: BP (!) 186/105   Pulse 76   Ht 6' (1.829 m)   Wt 185 lb (83.9 kg)   BMI 25.09 kg/m   Constitutional:  Alert and oriented, No acute distress. HEENT: [place] AT Respiratory: Normal respiratory effort, no increased work of breathing. Psychiatric: Normal mood and affect.   Pertinent Imaging: See Below  Assessment & Plan:    1. Left Renal Atrophy Secondary to long-standing hydronephrosis. Essentially non-functioning kidney with 10.5% function on renal scan.  2. Recurrent Nephrolithiasis KUB performed today was personally viewed and interpreted; no evidence of a right renal calculus. Unlikely development of a 13 mm stone in the right kidney within six months. Since this is an essential solitary kidney, a non-contrast CT scan is ordered for  further evaluation of right-sided calculus. Patient will be notified with the results.  I have reviewed the above documentation for accuracy and completeness, and I agree with the above.   Geraline Knapp, MD  [facility] [address], [address] [place], [place] [address] [phone]

## 2024-01-14 ENCOUNTER — Ambulatory Visit
Admission: RE | Admit: 2024-01-14 | Discharge: 2024-01-14 | Disposition: A | Discharge status: Home / Self Care | Source: Ambulatory Visit | Attending: Urology | Admitting: Urology

## 2024-01-14 DIAGNOSIS — K8020 Calculus of gallbladder without cholecystitis without obstruction: Secondary | ICD-10-CM | POA: Diagnosis not present

## 2024-01-14 DIAGNOSIS — N2889 Other specified disorders of kidney and ureter: Secondary | ICD-10-CM | POA: Diagnosis not present

## 2024-01-14 DIAGNOSIS — N261 Atrophy of kidney (terminal): Secondary | ICD-10-CM | POA: Diagnosis present

## 2024-01-14 DIAGNOSIS — N200 Calculus of kidney: Secondary | ICD-10-CM | POA: Diagnosis present

## 2024-01-14 DIAGNOSIS — N132 Hydronephrosis with renal and ureteral calculous obstruction: Secondary | ICD-10-CM | POA: Diagnosis not present

## 2024-01-19 ENCOUNTER — Ambulatory Visit: Payer: Self-pay | Admitting: Urology

## 2024-01-21 ENCOUNTER — Other Ambulatory Visit: Payer: Self-pay | Admitting: *Deleted

## 2024-01-21 DIAGNOSIS — N200 Calculus of kidney: Secondary | ICD-10-CM

## 2024-01-21 DIAGNOSIS — Q6211 Congenital occlusion of ureteropelvic junction: Secondary | ICD-10-CM

## 2025-01-07 ENCOUNTER — Ambulatory Visit: Admitting: Urology
